# Patient Record
Sex: Male | Born: 1965 | Race: White | Hispanic: No | Marital: Married | Smoking: Former smoker
Health system: Southern US, Community
[De-identification: ages and names within clinical notes are randomized; demographics above are authoritative.]

## PROBLEM LIST (undated history)

## (undated) DIAGNOSIS — J45909 Unspecified asthma, uncomplicated: Secondary | ICD-10-CM

## (undated) DIAGNOSIS — N200 Calculus of kidney: Secondary | ICD-10-CM

## (undated) DIAGNOSIS — T7840XA Allergy, unspecified, initial encounter: Secondary | ICD-10-CM

## (undated) DIAGNOSIS — I10 Essential (primary) hypertension: Secondary | ICD-10-CM

## (undated) DIAGNOSIS — Z720 Tobacco use: Secondary | ICD-10-CM

## (undated) HISTORY — DX: Allergy, unspecified, initial encounter: T78.40XA

## (undated) HISTORY — DX: Unspecified asthma, uncomplicated: J45.909

## (undated) HISTORY — DX: Calculus of kidney: N20.0

## (undated) HISTORY — PX: EYE SURGERY: SHX253

## (undated) HISTORY — DX: Tobacco use: Z72.0

---

## 2004-01-11 ENCOUNTER — Encounter: Admission: RE | Admit: 2004-01-11 | Discharge: 2004-01-11 | Payer: Self-pay | Admitting: Emergency Medicine

## 2007-07-24 ENCOUNTER — Encounter: Admission: RE | Admit: 2007-07-24 | Discharge: 2007-07-24 | Payer: Self-pay | Admitting: Emergency Medicine

## 2011-01-07 ENCOUNTER — Encounter: Payer: Self-pay | Admitting: Emergency Medicine

## 2012-12-12 ENCOUNTER — Ambulatory Visit (INDEPENDENT_AMBULATORY_CARE_PROVIDER_SITE_OTHER): Payer: 59 | Admitting: Emergency Medicine

## 2012-12-12 ENCOUNTER — Ambulatory Visit: Payer: 59

## 2012-12-12 VITALS — BP 142/86 | HR 137 | Temp 102.8°F | Resp 16 | Ht 69.5 in | Wt 167.0 lb

## 2012-12-12 DIAGNOSIS — R05 Cough: Secondary | ICD-10-CM

## 2012-12-12 DIAGNOSIS — R509 Fever, unspecified: Secondary | ICD-10-CM

## 2012-12-12 DIAGNOSIS — R062 Wheezing: Secondary | ICD-10-CM

## 2012-12-12 LAB — POCT CBC
Granulocyte percent: 87.5 %G — AB (ref 37–80)
HCT, POC: 51.2 % (ref 43.5–53.7)
MCH, POC: 32.1 pg — AB (ref 27–31.2)
MCHC: 32 g/dL (ref 31.8–35.4)
MCV: 100.1 fL — AB (ref 80–97)
MID (cbc): 0.8 (ref 0–0.9)
MPV: 8.3 fL (ref 0–99.8)
POC Granulocyte: 9.6 — AB (ref 2–6.9)
POC LYMPH PERCENT: 5.5 %L — AB (ref 10–50)
Platelet Count, POC: 287 10*3/uL (ref 142–424)
RBC: 5.11 M/uL (ref 4.69–6.13)
RDW, POC: 13.3 %

## 2012-12-12 LAB — POCT INFLUENZA A/B
Influenza A, POC: NEGATIVE
Influenza B, POC: NEGATIVE

## 2012-12-12 MED ORDER — ALBUTEROL SULFATE (2.5 MG/3ML) 0.083% IN NEBU: 2.5000 mg | INHALATION_SOLUTION | Freq: Once | RESPIRATORY_TRACT | Status: DC

## 2012-12-12 MED ORDER — IPRATROPIUM BROMIDE 0.02 % IN SOLN: 0.5000 mg | Freq: Once | RESPIRATORY_TRACT | Status: DC

## 2012-12-12 MED ORDER — HYDROCOD POLST-CHLORPHEN POLST 10-8 MG/5ML PO LQCR: 5.0000 mL | Freq: Two times a day (BID) | ORAL | Status: DC | PRN

## 2012-12-12 MED ORDER — AZITHROMYCIN 500 MG PO TABS: 500.0000 mg | ORAL_TABLET | Freq: Every day | ORAL | Status: DC

## 2012-12-12 MED ORDER — ALBUTEROL SULFATE HFA 108 (90 BASE) MCG/ACT IN AERS: 2.0000 | INHALATION_SPRAY | RESPIRATORY_TRACT | Status: DC | PRN

## 2012-12-12 NOTE — Progress Notes (Addendum)
Subjective:    Patient ID: Victor Tran, male    DOB: Oct 06, 1966, 46 y.o.   MRN: [id number]  HPI  This 46 y.o. male presents for evaluation of sudden onset of cough, congestion, fever/chills and myalgias yesterday.  He reports that he gets bronchitis each year, but it's never been this bad. No GU/GI symptoms.   Past Medical History  Diagnosis Date  . Allergy   . Asthma     Past Surgical History  Procedure Date  . Eye surgery     Prior to Admission medications   Not on File    No Known Allergies  History   Social History  . Marital Status: Widowed    Spouse Name: n/a    Number of Children: 0  . Years of Education: 12   Occupational History  . Dock Driver    Social History Main Topics  . Smoking status: Never Smoker   . Smokeless tobacco: Not on file  . Alcohol Use: 12.6 oz/week    21 Cans of beer per week     Comment: 2-3 cans of beer daily  . Drug Use: No  . Sexually Active: Yes    Birth Control/ Protection: Condom     Comment: 2 partners in the past 12 months (11/2012)    Family History  Problem Relation Age of Onset  . Diabetes Mother   . Hypertension Mother   . Heart disease Mother   . Stroke Father   . Heart disease Father   . HIV/AIDS Brother   . Cancer Brother 34    Colon Cancer    Review of Systems As above.    Objective:   Physical Exam Blood pressure 142/86, pulse 137, temperature 102.8 F (39.3 C), temperature source Oral, resp. rate 16, height 5' 9.5" (1.765 m), weight 167 lb (75.751 kg), SpO2 96.00%. Body mass index is 24.31 kg/(m^2). Well-developed, well nourished WM who is awake, alert and oriented, in mild to moderate. HEENT: [place]/AT, PERRL, EOMI.  Sclera and conjunctiva are clear.  EAC are patent, TMs are normal in appearance. Nasal mucosa is congested, pink and moist. OP is clear. Neck: supple, non-tender, no lymphadenopathy, thyromegaly. Heart: tachycardic, regular rhythm, no murmur Lungs: normal effort, diffuse high pitched  musical wheezes throughout both lung fields. Extremities: no cyanosis, clubbing or edema. Skin: warm and dry without rash. Psychologic: good mood and appropriate affect, normal speech and behavior.   Results for orders placed in visit on 12/12/12  POCT CBC      Component Value Range   WBC 11.0 (*) 4.6 - 10.2 K/uL   Lymph, poc 0.6  0.6 - 3.4   POC LYMPH PERCENT 5.5 (*) 10 - 50 %L   MID (cbc) 0.8  0 - 0.9   POC MID % 7.0  0 - 12 %M   POC Granulocyte 9.6 (*) 2 - 6.9   Granulocyte percent 87.5 (*) 37 - 80 %G   RBC 5.11  4.69 - 6.13 M/uL   Hemoglobin 16.4  14.1 - 18.1 g/dL   HCT, POC 82.9  56.2 - 53.7 %   MCV 100.1 (*) 80 - 97 fL   MCH, POC 32.1 (*) 27 - 31.2 pg   MCHC 32.0  31.8 - 35.4 g/dL   RDW, POC 13.0     Platelet Count, POC 287  142 - 424 K/uL   MPV 8.3  0 - 99.8 fL  POCT INFLUENZA A/B      Component Value Range  Influenza A, POC Negative     Influenza B, POC Negative      CXR: [facility] reading (PRIMARY) by  Dr. Cleta Alberts.  Increased markings RIGHT base.      Assessment & Plan:   1. Cough -bronchitis vs. Early RLL infiltrate DG Chest 2 View, azithromycin (ZITHROMAX) 500 MG tablet, chlorpheniramine-HYDROcodone (TUSSIONEX PENNKINETIC ER) 10-8 MG/5ML LQCR  2. Fever  POCT CBC, POCT Influenza A/B, DG Chest 2 View  3. Wheezing  albuterol (PROVENTIL) (2.5 MG/3ML) 0.083% nebulizer solution 2.5 mg, ipratropium (ATROVENT) nebulizer solution 0.5 mg, DG Chest 2 View, albuterol (PROVENTIL HFA;VENTOLIN HFA) 108 (90 BASE) MCG/ACT inhaler

## 2012-12-12 NOTE — Patient Instructions (Addendum)
Get plenty of rest and drink at least 64 ounces of water daily. I encourage you to cut back on your alcohol consumption and tobacco use.

## 2012-12-16 ENCOUNTER — Ambulatory Visit (INDEPENDENT_AMBULATORY_CARE_PROVIDER_SITE_OTHER): Payer: 59 | Admitting: Physician Assistant

## 2012-12-16 VITALS — BP 116/78 | HR 114 | Temp 98.8°F | Resp 18 | Ht 69.5 in | Wt 166.2 lb

## 2012-12-16 DIAGNOSIS — R509 Fever, unspecified: Secondary | ICD-10-CM

## 2012-12-16 DIAGNOSIS — R059 Cough, unspecified: Secondary | ICD-10-CM

## 2012-12-16 DIAGNOSIS — R05 Cough: Secondary | ICD-10-CM

## 2012-12-16 DIAGNOSIS — R062 Wheezing: Secondary | ICD-10-CM

## 2012-12-16 LAB — POCT CBC
HCT, POC: 50.3 % (ref 43.5–53.7)
Hemoglobin: 16.2 g/dL (ref 14.1–18.1)
MCH, POC: 32 pg — AB (ref 27–31.2)
MCHC: 32.2 g/dL (ref 31.8–35.4)
MCV: 99.3 fL — AB (ref 80–97)
MID (cbc): 0.9 (ref 0–0.9)
MPV: 7.9 fL (ref 0–99.8)
POC Granulocyte: 3.3 (ref 2–6.9)
POC LYMPH PERCENT: 30.6 %L (ref 10–50)
POC MID %: 14.5 %M — AB (ref 0–12)
RBC: 5.07 M/uL (ref 4.69–6.13)
WBC: 6 10*3/uL (ref 4.6–10.2)

## 2012-12-16 MED ORDER — IPRATROPIUM BROMIDE 0.02 % IN SOLN: 0.5000 mg | Freq: Once | RESPIRATORY_TRACT | Status: DC

## 2012-12-16 MED ORDER — ALBUTEROL SULFATE (2.5 MG/3ML) 0.083% IN NEBU: 2.5000 mg | INHALATION_SOLUTION | Freq: Once | RESPIRATORY_TRACT | Status: DC

## 2012-12-16 MED ORDER — PREDNISONE 20 MG PO TABS: ORAL_TABLET | ORAL | Status: DC

## 2012-12-16 NOTE — Patient Instructions (Addendum)
Get plenty of rest and drink at least 64 ounces of water daily. 

## 2012-12-16 NOTE — Progress Notes (Signed)
Subjective:    Patient ID: Victor Tran, male    DOB: 1966-11-05, 46 y.o.   MRN: [id number]  HPI This 46 y.o. male presents for evaluation of persistent illness.  He was evaluated for respiratory infection 12/12/2012.  CBC revealed a mildly elevated WBC and there was a question of an early infiltrate in the RLL.  Influenza test was negative. He started Azithromycin 500 mg QD, Tussionex, and albuterol.    He reports today that he is minimally improved.  He also reports continued fevers, 99.2-102.8. The cough is worse with lying down.  Not smoking, but his girlfriend is. She is with him today, and is a Engineer, civil (consulting) (she is not currently working due to back surgery a couple of months ago).   Past Medical History  Diagnosis Date  . Allergy   . Asthma     Past Surgical History  Procedure Date  . Eye surgery     Prior to Admission medications   Medication Sig Start Date End Date Taking? Authorizing Provider  albuterol (PROVENTIL HFA;VENTOLIN HFA) 108 (90 BASE) MCG/ACT inhaler Inhale 2 puffs into the lungs every 4 (four) hours as needed for wheezing (cough, shortness of breath or wheezing.). 12/12/12  Yes Kaston Faughn S Meaghann Choo, PA-C  azithromycin (ZITHROMAX) 500 MG tablet Take 1 tablet (500 mg total) by mouth daily. 12/12/12  Yes Edahi Kroening S Luvenia Cranford, PA-C  chlorpheniramine-HYDROcodone (TUSSIONEX PENNKINETIC ER) 10-8 MG/5ML LQCR Take 5 mLs by mouth every 12 (twelve) hours as needed (cough). 12/12/12  Yes Andreina Outten S Nithya Meriweather, PA-C    No Known Allergies  History   Social History  . Marital Status: Widowed    Spouse Name: n/a    Number of Children: 0  . Years of Education: 12   Occupational History  . Dock Driver    Social History Main Topics  . Smoking status: Never Smoker   . Smokeless tobacco: Not on file  . Alcohol Use: 12.6 oz/week    21 Cans of beer per week     Comment: 2-3 cans of beer daily  . Drug Use: No  . Sexually Active: Yes    Birth Control/ Protection: Condom     Comment: 2  partners in the past 12 months (11/2012)    Family History  Problem Relation Age of Onset  . Diabetes Mother   . Hypertension Mother   . Heart disease Mother   . Stroke Father   . Heart disease Father   . HIV/AIDS Brother   . Cancer Brother 49    Colon Cancer   Review of Systems As above.    Objective:   Physical Exam Blood pressure 116/78, pulse 114, temperature 98.8 F (37.1 C), temperature source Oral, resp. rate 18, height 5' 9.5" (1.765 m), weight 166 lb 3.2 oz (75.388 kg), SpO2 98.00%. Body mass index is 24.19 kg/(m^2). Well-developed, well nourished WM who is awake, alert and oriented, in moderate distress. HEENT: [place]/AT, PERRL, EOMI.  Sclera and conjunctiva are clear.  EAC are patent, TMs are normal in appearance. Nasal mucosa is pink and moist. OP is clear. Neck: supple, non-tender, no lymphadenopathy, thyromegaly. Heart: tachycardia, regular rhythm, no murmur Lungs: increased effort, and rate, improved with neb treatment. High-pitched musical wheezes throughout, also reduced with neb, though not resolved. Abdomen: normo-active bowel sounds, supple, non-tender, no mass or organomegaly. Extremities: no cyanosis, clubbing or edema. Skin: warm and dry without rash. Psychologic: good mood and appropriate affect, normal speech and behavior.   Results for orders placed in  visit on 12/16/12  POCT CBC      Component Value Range   WBC 6.0  4.6 - 10.2 K/uL   Lymph, poc 1.8  0.6 - 3.4   POC LYMPH PERCENT 30.6  10 - 50 %L   MID (cbc) 0.9  0 - 0.9   POC MID % 14.5 (*) 0 - 12 %M   POC Granulocyte 3.3  2 - 6.9   Granulocyte percent 54.9  37 - 80 %G   RBC 5.07  4.69 - 6.13 M/uL   Hemoglobin 16.2  14.1 - 18.1 g/dL   HCT, POC 16.1  09.6 - 53.7 %   MCV 99.3 (*) 80 - 97 fL   MCH, POC 32.0 (*) 27 - 31.2 pg   MCHC 32.2  31.8 - 35.4 g/dL   RDW, POC 04.5     Platelet Count, POC 330  142 - 424 K/uL   MPV 7.9  0 - 99.8 fL       Assessment & Plan:   1. Cough  predniSONE  (DELTASONE) 20 MG tablet  2. Wheezing  albuterol (PROVENTIL) (2.5 MG/3ML) 0.083% nebulizer solution 2.5 mg, ipratropium (ATROVENT) nebulizer solution 0.5 mg, predniSONE (DELTASONE) 20 MG tablet  3. Fever  POCT CBC   I suspect that the initial illness was viral, and prescribed the antibiotic due to his smoking and very mild elevated WBC.  He most likely has influenza.  He'll complete the 5 day course of azithromycin and start a prednisone taper today, and continue the tussionex and albuterol at home.  OOW through 12/18/12, RTW 12/19/12.  If unable, he should RTC for re-evaluation.

## 2012-12-21 ENCOUNTER — Emergency Department (INDEPENDENT_AMBULATORY_CARE_PROVIDER_SITE_OTHER): Payer: 59

## 2012-12-21 ENCOUNTER — Encounter (HOSPITAL_COMMUNITY): Payer: Self-pay | Admitting: Emergency Medicine

## 2012-12-21 ENCOUNTER — Emergency Department (HOSPITAL_COMMUNITY)
Admission: EM | Admit: 2012-12-21 | Discharge: 2012-12-21 | Disposition: A | Discharge status: Nursing Home (Includes ICF) | Payer: 59 | Source: Home / Self Care | Attending: Family Medicine | Admitting: Family Medicine

## 2012-12-21 ENCOUNTER — Encounter (HOSPITAL_COMMUNITY): Payer: Self-pay | Admitting: Nurse Practitioner

## 2012-12-21 ENCOUNTER — Inpatient Hospital Stay (HOSPITAL_COMMUNITY)
Admission: EM | Admit: 2012-12-21 | Discharge: 2012-12-23 | DRG: 194 | Disposition: A | Discharge status: Home / Self Care | Payer: 59 | Attending: Internal Medicine | Admitting: Internal Medicine

## 2012-12-21 DIAGNOSIS — I498 Other specified cardiac arrhythmias: Secondary | ICD-10-CM | POA: Diagnosis present

## 2012-12-21 DIAGNOSIS — J189 Pneumonia, unspecified organism: Principal | ICD-10-CM | POA: Diagnosis present

## 2012-12-21 DIAGNOSIS — J438 Other emphysema: Secondary | ICD-10-CM

## 2012-12-21 DIAGNOSIS — D72829 Elevated white blood cell count, unspecified: Secondary | ICD-10-CM | POA: Diagnosis present

## 2012-12-21 DIAGNOSIS — Z79899 Other long term (current) drug therapy: Secondary | ICD-10-CM

## 2012-12-21 DIAGNOSIS — IMO0002 Reserved for concepts with insufficient information to code with codable children: Secondary | ICD-10-CM

## 2012-12-21 DIAGNOSIS — T380X5A Adverse effect of glucocorticoids and synthetic analogues, initial encounter: Secondary | ICD-10-CM | POA: Diagnosis present

## 2012-12-21 DIAGNOSIS — R0902 Hypoxemia: Secondary | ICD-10-CM | POA: Diagnosis present

## 2012-12-21 DIAGNOSIS — J45909 Unspecified asthma, uncomplicated: Secondary | ICD-10-CM | POA: Diagnosis present

## 2012-12-21 DIAGNOSIS — J439 Emphysema, unspecified: Secondary | ICD-10-CM

## 2012-12-21 DIAGNOSIS — R000 Tachycardia, unspecified: Secondary | ICD-10-CM

## 2012-12-21 DIAGNOSIS — J45901 Unspecified asthma with (acute) exacerbation: Secondary | ICD-10-CM | POA: Diagnosis present

## 2012-12-21 DIAGNOSIS — R05 Cough: Secondary | ICD-10-CM

## 2012-12-21 DIAGNOSIS — Z87891 Personal history of nicotine dependence: Secondary | ICD-10-CM

## 2012-12-21 DIAGNOSIS — R062 Wheezing: Secondary | ICD-10-CM

## 2012-12-21 LAB — CBC
HCT: 41.9 % (ref 39.0–52.0)
Hemoglobin: 15 g/dL (ref 13.0–17.0)
MCH: 32.5 pg (ref 26.0–34.0)
MCHC: 35.8 g/dL (ref 30.0–36.0)
MCV: 90.7 fL (ref 78.0–100.0)
Platelets: 378 K/uL (ref 150–400)
Platelets: 426 10*3/uL — ABNORMAL HIGH (ref 150–400)
RBC: 4.62 MIL/uL (ref 4.22–5.81)
RBC: 4.43 MIL/uL (ref 4.22–5.81)
RDW: 12.1 % (ref 11.5–15.5)
RDW: 12.3 % (ref 11.5–15.5)
WBC: 15.7 K/uL — ABNORMAL HIGH (ref 4.0–10.5)
WBC: 20.1 10*3/uL — ABNORMAL HIGH (ref 4.0–10.5)

## 2012-12-21 LAB — BASIC METABOLIC PANEL
BUN: 12 mg/dL (ref 6–23)
CO2: 22 mEq/L (ref 19–32)
Calcium: 8.9 mg/dL (ref 8.4–10.5)
Chloride: 96 mEq/L (ref 96–112)
Creatinine, Ser: 0.97 mg/dL (ref 0.50–1.35)
GFR calc Af Amer: >90 mL/min (ref >90)
GFR calc non Af Amer: >90 mL/min (ref >90)
Glucose, Bld: 239 mg/dL — ABNORMAL HIGH (ref 70–99)
Potassium: 3.8 mEq/L (ref 3.5–5.1)
Sodium: 132 mEq/L — ABNORMAL LOW (ref 135–145)

## 2012-12-21 LAB — GLUCOSE, CAPILLARY: Glucose-Capillary: 244 mg/dL — ABNORMAL HIGH (ref 70–99)

## 2012-12-21 LAB — CREATININE, SERUM
Creatinine, Ser: 1.06 mg/dL (ref 0.50–1.35)
GFR calc Af Amer: >90 mL/min (ref >90)

## 2012-12-21 LAB — D-DIMER, QUANTITATIVE: D-Dimer, Quant: <0.27 ug/mL-FEU (ref 0.00–0.48)

## 2012-12-21 MED ORDER — SODIUM CHLORIDE 0.9 % IV SOLN
INTRAVENOUS | Status: DC
  Administered 2012-12-21: 19:00:00 via INTRAVENOUS

## 2012-12-21 MED ORDER — METHYLPREDNISOLONE SODIUM SUCC 125 MG IJ SOLR
INTRAMUSCULAR | Status: AC
  Filled 2012-12-21: qty 2

## 2012-12-21 MED ORDER — IPRATROPIUM BROMIDE 0.02 % IN SOLN
0.5000 mg | Freq: Once | RESPIRATORY_TRACT | Status: AC
  Administered 2012-12-21: 0.5 mg via RESPIRATORY_TRACT
  Filled 2012-12-21: qty 2.5

## 2012-12-21 MED ORDER — OSELTAMIVIR PHOSPHATE 75 MG PO CAPS
75.0000 mg | ORAL_CAPSULE | Freq: Two times a day (BID) | ORAL | Status: DC
  Administered 2012-12-21: 75 mg via ORAL
  Filled 2012-12-21 (×3): qty 1

## 2012-12-21 MED ORDER — AZITHROMYCIN 250 MG PO TABS: 250.0000 mg | ORAL_TABLET | Freq: Every day | ORAL | Status: DC

## 2012-12-21 MED ORDER — METHYLPREDNISOLONE SODIUM SUCC 125 MG IJ SOLR
125.0000 mg | Freq: Once | INTRAMUSCULAR | Status: AC
  Administered 2012-12-21: 125 mg via INTRAMUSCULAR

## 2012-12-21 MED ORDER — GUAIFENESIN-CODEINE 100-10 MG/5ML PO SYRP: 5.0000 mL | ORAL_SOLUTION | Freq: Three times a day (TID) | ORAL | Status: DC | PRN

## 2012-12-21 MED ORDER — DEXTROSE 5 % IV SOLN
500.0000 mg | Freq: Once | INTRAVENOUS | Status: AC
  Administered 2012-12-21: 500 mg via INTRAVENOUS
  Filled 2012-12-21: qty 500

## 2012-12-21 MED ORDER — ALBUTEROL SULFATE HFA 108 (90 BASE) MCG/ACT IN AERS: 2.0000 | INHALATION_SPRAY | Freq: Once | RESPIRATORY_TRACT | Status: DC

## 2012-12-21 MED ORDER — LEVALBUTEROL HCL 0.63 MG/3ML IN NEBU
0.6300 mg | INHALATION_SOLUTION | Freq: Four times a day (QID) | RESPIRATORY_TRACT | Status: DC
  Administered 2012-12-21 – 2012-12-22 (×5): 0.63 mg via RESPIRATORY_TRACT
  Filled 2012-12-21 (×7): qty 3

## 2012-12-21 MED ORDER — IPRATROPIUM BROMIDE 0.02 % IN SOLN
0.5000 mg | Freq: Four times a day (QID) | RESPIRATORY_TRACT | Status: DC
  Administered 2012-12-21 – 2012-12-22 (×5): 0.5 mg via RESPIRATORY_TRACT
  Filled 2012-12-21 (×5): qty 2.5

## 2012-12-21 MED ORDER — SODIUM CHLORIDE 0.9 % IV SOLN
INTRAVENOUS | Status: DC
  Administered 2012-12-21 – 2012-12-23 (×3): via INTRAVENOUS

## 2012-12-21 MED ORDER — DEXTROSE 5 % IV SOLN
1.0000 g | Freq: Once | INTRAVENOUS | Status: AC
  Administered 2012-12-21: 1 g via INTRAVENOUS
  Filled 2012-12-21: qty 10

## 2012-12-21 MED ORDER — ALBUTEROL SULFATE (5 MG/ML) 0.5% IN NEBU
5.0000 mg | INHALATION_SOLUTION | Freq: Once | RESPIRATORY_TRACT | Status: AC
  Administered 2012-12-21: 5 mg via RESPIRATORY_TRACT

## 2012-12-21 MED ORDER — HYDROCOD POLST-CHLORPHEN POLST 10-8 MG/5ML PO LQCR
5.0000 mL | Freq: Two times a day (BID) | ORAL | Status: DC
  Administered 2012-12-21 – 2012-12-23 (×4): 5 mL via ORAL
  Filled 2012-12-21 (×4): qty 5

## 2012-12-21 MED ORDER — LEVOFLOXACIN IN D5W 750 MG/150ML IV SOLN
750.0000 mg | INTRAVENOUS | Status: DC
  Administered 2012-12-21 – 2012-12-22 (×2): 750 mg via INTRAVENOUS
  Filled 2012-12-21 (×3): qty 150

## 2012-12-21 MED ORDER — GUAIFENESIN-CODEINE 100-10 MG/5ML PO SOLN
10.0000 mL | Freq: Once | ORAL | Status: AC
  Administered 2012-12-21: 10 mL via ORAL
  Filled 2012-12-21: qty 10

## 2012-12-21 MED ORDER — ALBUTEROL SULFATE (5 MG/ML) 0.5% IN NEBU: 2.5000 mg | INHALATION_SOLUTION | RESPIRATORY_TRACT | Status: DC | PRN

## 2012-12-21 MED ORDER — ACETAMINOPHEN 325 MG PO TABS: 650.0000 mg | ORAL_TABLET | Freq: Four times a day (QID) | ORAL | Status: DC | PRN

## 2012-12-21 MED ORDER — ALBUTEROL SULFATE (5 MG/ML) 0.5% IN NEBU
INHALATION_SOLUTION | RESPIRATORY_TRACT | Status: AC
  Filled 2012-12-21: qty 1

## 2012-12-21 MED ORDER — ALBUTEROL (5 MG/ML) CONTINUOUS INHALATION SOLN
15.0000 mg | INHALATION_SOLUTION | Freq: Once | RESPIRATORY_TRACT | Status: AC
  Administered 2012-12-21: 15 mg via RESPIRATORY_TRACT
  Filled 2012-12-21: qty 20

## 2012-12-21 MED ORDER — INSULIN ASPART 100 UNIT/ML ~~LOC~~ SOLN
0.0000 [IU] | Freq: Every day | SUBCUTANEOUS | Status: DC
  Administered 2012-12-21 – 2012-12-22 (×2): 2 [IU] via SUBCUTANEOUS

## 2012-12-21 MED ORDER — METHYLPREDNISOLONE SODIUM SUCC 125 MG IJ SOLR
80.0000 mg | Freq: Three times a day (TID) | INTRAMUSCULAR | Status: DC
  Administered 2012-12-21: 80 mg via INTRAVENOUS
  Filled 2012-12-21 (×4): qty 1.28

## 2012-12-21 MED ORDER — ENOXAPARIN SODIUM 40 MG/0.4ML ~~LOC~~ SOLN
40.0000 mg | SUBCUTANEOUS | Status: DC
  Administered 2012-12-21 – 2012-12-22 (×2): 40 mg via SUBCUTANEOUS
  Filled 2012-12-21 (×3): qty 0.4

## 2012-12-21 MED ORDER — INSULIN ASPART 100 UNIT/ML ~~LOC~~ SOLN
0.0000 [IU] | Freq: Three times a day (TID) | SUBCUTANEOUS | Status: DC
Start: 2012-12-22 — End: 2012-12-23
  Administered 2012-12-22 – 2012-12-23 (×2): 3 [IU] via SUBCUTANEOUS

## 2012-12-21 MED ORDER — PANTOPRAZOLE SODIUM 40 MG PO TBEC
40.0000 mg | DELAYED_RELEASE_TABLET | Freq: Every day | ORAL | Status: DC
  Administered 2012-12-22: 40 mg via ORAL
  Filled 2012-12-21: qty 1

## 2012-12-21 MED ORDER — IPRATROPIUM BROMIDE 0.02 % IN SOLN
0.5000 mg | Freq: Once | RESPIRATORY_TRACT | Status: AC
  Administered 2012-12-21: 0.5 mg via RESPIRATORY_TRACT

## 2012-12-21 MED ORDER — SODIUM CHLORIDE 0.9 % IV BOLUS (SEPSIS)
1000.0000 mL | Freq: Once | INTRAVENOUS | Status: AC
  Administered 2012-12-21: 1000 mL via INTRAVENOUS

## 2012-12-21 MED ORDER — ALBUTEROL SULFATE (5 MG/ML) 0.5% IN NEBU
5.0000 mg | INHALATION_SOLUTION | Freq: Once | RESPIRATORY_TRACT | Status: AC
  Administered 2012-12-21: 5 mg via RESPIRATORY_TRACT
  Filled 2012-12-21: qty 1

## 2012-12-21 NOTE — ED Provider Notes (Signed)
History    46yM with cough and sob. Onset around christmas. Progressively worsening. Seen at [facility] today and referred to ED for further eval after had CXR consistent with pneumonia. Patient is a smoker. Is complaining of some chest pain only when he calms. Subjective fever. No unusual leg pain or swelling. Denies history of blood clot.   CSN: [id number]  Arrival date & time 12/21/12  1248   First MD Initiated Contact with Patient 12/21/12 1333      Chief Complaint  Patient presents with  . Shortness of Breath    (Consider location/radiation/quality/duration/timing/severity/associated sxs/prior treatment) HPI  Past Medical History  Diagnosis Date  . Allergy   . Asthma     Past Surgical History  Procedure Date  . Eye surgery     Family History  Problem Relation Age of Onset  . Diabetes Mother   . Hypertension Mother   . Heart disease Mother   . Stroke Father   . Heart disease Father   . HIV/AIDS Brother   . Cancer Brother 75    Colon Cancer    History  Substance Use Topics  . Smoking status: Never Smoker   . Smokeless tobacco: Not on file  . Alcohol Use: 12.6 oz/week    21 Cans of beer per week     Comment: 2-3 cans of beer daily      Review of Systems  All systems reviewed and negative, other than as noted in HPI.   Allergies  Review of patient's allergies indicates no known allergies.  Home Medications   Current Outpatient Rx  Name  Route  Sig  Dispense  Refill  . ALBUTEROL SULFATE HFA 108 (90 BASE) MCG/ACT IN AERS   Inhalation   Inhale 2 puffs into the lungs every 4 (four) hours as needed for wheezing (cough, shortness of breath or wheezing.).   1 Inhaler   1   . AZITHROMYCIN 500 MG PO TABS   Oral   Take 1 tablet (500 mg total) by mouth daily.   3 tablet   0   . HYDROCOD POLST-CPM POLST ER 10-8 MG/5ML PO LQCR   Oral   Take 5 mLs by mouth every 12 (twelve) hours as needed (cough).   140 mL   0   . PREDNISONE 20 MG PO TABS      Take 3 PO  QAM x3days, 2 PO QAM x3days, 1 PO QAM x3days   18 tablet   0     BP 129/83  Pulse 104  Temp 97 F (36.1 C) (Oral)  Resp 16  SpO2 94%  Physical Exam  Nursing note and vitals reviewed. Constitutional: He appears well-developed and well-nourished. No distress.  HENT:  Head: Normocephalic and atraumatic.  Eyes: Conjunctivae normal are normal. Right eye exhibits no discharge. Left eye exhibits no discharge.  Neck: Neck supple.  Cardiovascular: Regular rhythm and normal heart sounds.  Exam reveals no gallop and no friction rub.   No murmur heard.      Tachycardic with a regular rhythm  Pulmonary/Chest: Effort normal. He has wheezes.       Expiratory wheezing bilaterally. Mild tachypnea.  Abdominal: Soft. He exhibits no distension. There is no tenderness.  Musculoskeletal: He exhibits no edema and no tenderness.       Lower extremities symmetric as compared to each other. No calf tenderness. Negative Homan's. No palpable cords.   Neurological: He is alert.  Skin: Skin is warm and dry. He is not diaphoretic.  Psychiatric: He has a normal mood and affect. His behavior is normal. Thought content normal.    ED Course  Procedures (including critical care time)  Labs Reviewed - No data to display No results found.   1. CAP (community acquired pneumonia)   2. Reactive airway disease with wheezing   3. Tachycardia   4. Cough   5. Wheezing   6. PNA (pneumonia)       MDM  47 year old male with cough and shortness of breath. Chest x-ray is consistent with pneumonia. Patient remains hypoxic on room air the high 80s to low 90s despite multiple nebs. Will admit.    2:06 PM Reassessed. Better air movement after neb. Abx about to be hung. Will repeat neb and continue to monitor.      Raeford Razor, MD 12/24/12 [phone]

## 2012-12-21 NOTE — ED Provider Notes (Signed)
History     CSN: [id number]  Arrival date & time 12/21/12  1110   First MD Initiated Contact with Patient 12/21/12 1123      Chief Complaint  Patient presents with  . URI    sob. productive cough. runny nose. chest heaviness.    (Consider location/radiation/quality/duration/timing/severity/associated sxs/prior treatment) Patient is a 47 y.o. male presenting with URI. The history is provided by the patient.  URI The primary symptoms include fever, cough and wheezing. Primary symptoms do not include nausea or vomiting. Episode onset: sick since 12/25, [facility] and given neb, prednisone and albut meds without relief. This is a new problem. The problem has been gradually worsening.  Risk factors for severe complications from URI include chronic respiratory disease (girlfriend smokes., has childhood h/o asthma.).    Past Medical History  Diagnosis Date  . Allergy   . Asthma     Past Surgical History  Procedure Date  . Eye surgery     Family History  Problem Relation Age of Onset  . Diabetes Mother   . Hypertension Mother   . Heart disease Mother   . Stroke Father   . Heart disease Father   . HIV/AIDS Brother   . Cancer Brother 16    Colon Cancer    History  Substance Use Topics  . Smoking status: Never Smoker   . Smokeless tobacco: Not on file  . Alcohol Use: 12.6 oz/week    21 Cans of beer per week     Comment: 2-3 cans of beer daily      Review of Systems  Constitutional: Positive for fever.  HENT: Negative.   Respiratory: Positive for cough, chest tightness, shortness of breath and wheezing.   Cardiovascular: Negative.   Gastrointestinal: Negative.  Negative for nausea and vomiting.    Allergies  Review of patient's allergies indicates no known allergies.  Home Medications   Current Outpatient Rx  Name  Route  Sig  Dispense  Refill  . ALBUTEROL SULFATE HFA 108 (90 BASE) MCG/ACT IN AERS   Inhalation   Inhale 2 puffs into the  lungs every 4 (four) hours as needed for wheezing (cough, shortness of breath or wheezing.).   1 Inhaler   1   . HYDROCOD POLST-CPM POLST ER 10-8 MG/5ML PO LQCR   Oral   Take 5 mLs by mouth every 12 (twelve) hours as needed (cough).   140 mL   0   . PREDNISONE 20 MG PO TABS      Take 3 PO QAM x3days, 2 PO QAM x3days, 1 PO QAM x3days   18 tablet   0   . AZITHROMYCIN 500 MG PO TABS   Oral   Take 1 tablet (500 mg total) by mouth daily.   3 tablet   0     BP 118/82  Pulse 92  Temp 98.7 F (37.1 C) (Oral)  Resp 18  SpO2 95%  Physical Exam  Nursing note and vitals reviewed. Constitutional: He is oriented to person, place, and time. He appears well-developed and well-nourished. He appears distressed.  HENT:  Head: Normocephalic.  Right Ear: External ear normal.  Left Ear: External ear normal.  Mouth/Throat: Oropharynx is clear and moist.  Eyes: Pupils are equal, round, and reactive to light.  Neck: Normal range of motion. Neck supple.  Cardiovascular: Normal rate and normal heart sounds.   Pulmonary/Chest: He has decreased breath sounds. He has wheezes. He has rales in the right  lower field.  Abdominal: Soft. Bowel sounds are normal.  Lymphadenopathy:    He has no cervical adenopathy.  Neurological: He is alert and oriented to person, place, and time.  Skin: Skin is warm and dry.    ED Course  Procedures (including critical care time)  Labs Reviewed - No data to display Dg Chest 2 View  12/21/2012  *RADIOLOGY REPORT*  Clinical Data: Cough and shortness of breath  CHEST - 2 VIEW  Comparison: 12/12/2012  Findings: Hyperinflation suggests COPD/emphysema.  There is subtle right middle lobe airspace opacity, new since the prior study.  No pleural effusion.  Heart size normal.  No acute osseous abnormality. Minimal patchy left lower lobe airspace opacities also noted.  IMPRESSION: Right middle lobe airspace disease, which could indicate pneumonia. Follow-up to radiographic  resolution is recommended in 4-6 weeks.  Left lower lobe airspace opacity, which could represent atelectasis or pneumonia and also amenable to follow-up.   Original Report Authenticated By: Christiana Pellant, M.D.      1. 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         Linna Hoff, MD 12/21/12 1230

## 2012-12-21 NOTE — H&P (Signed)
PATIENT DETAILS Name: Victor Tran Age: 47 y.o. Sex: male Date of Birth: Sep 08, 1966 Admit Date: 12/21/2012 PCP:No primary provider on file.   CHIEF COMPLAINT:  Cough, fever and shortness of breath with wheezing since 12/12/2012  HPI: Patient is a 47 year old Caucasian male with a history of childhood asthma, ex-smoker (quit 6 years ago) who presented to the hospital after being referred from the urgent care clinic for evaluation of the above noted complaints. Patient's history dates back to 12/12/12 when he started having dry cough with fever. He also started having shortness of breath and was wheezing. He was seen at a local urgent care and was thought to have acute bronchitis. He was given a 3 day course of Z-Pak, tapering steroids and a cough syrup. Apparently a influenza panel was negative then. He again presented to the same urgent care on 12/30 because of minimal improvement. He was asked to continue the same treatment. He presented back to be urgent care clinic today for persistent cough and wheezing. Chest x-ray done showed right middle lobe airspace disease and a possible left lower lobe airspace opacity as well. Patient was also found to be slightly hypoxic on room air, he was found to have a white count of 15,000 (but on steroids), I was subsequently asked to admit this patient for further evaluation and treatment.  - Patient claims that his cough is now mostly productive with yellowish phlegm. There is no history of headache. He does not have any chest pain. He has received numerous nebulized bronchodilators here in the Emergency room and his wheezing is somewhat better. She denies any abdominal pain. He denies any nasal congestion. He denies any nausea, vomiting or diarrhea.  He denies any rash  - His significant other his RN. He is exposed to secondhand smoke.    ALLERGIES:  No Known Allergies  PAST MEDICAL HISTORY: Past Medical History  Diagnosis Date  . Allergy   . Asthma       PAST SURGICAL HISTORY: Past Surgical History  Procedure Date  . Eye surgery     MEDICATIONS AT HOME: Prior to Admission medications   Medication Sig Start Date End Date Taking? Authorizing Provider  albuterol (PROVENTIL HFA;VENTOLIN HFA) 108 (90 BASE) MCG/ACT inhaler Inhale 2 puffs into the lungs every 4 (four) hours as needed for wheezing (cough, shortness of breath or wheezing.). 12/12/12  Yes Chelle S Jeffery, PA-C  chlorpheniramine-HYDROcodone (TUSSIONEX PENNKINETIC ER) 10-8 MG/5ML LQCR Take 5 mLs by mouth every 12 (twelve) hours as needed (cough). 12/12/12  Yes Chelle S Jeffery, PA-C  guaiFENesin (MUCINEX) 600 MG 12 hr tablet Take 1,200 mg by mouth daily as needed. For cough   Yes Historical Provider, MD  ibuprofen (ADVIL,MOTRIN) 200 MG tablet Take 400 mg by mouth every 6 (six) hours as needed. For pain   Yes Historical Provider, MD  predniSONE (DELTASONE) 20 MG tablet Take 3 PO QAM x3days, 2 PO QAM x3days, 1 PO QAM x3days 12/16/12  Yes Chelle S Jeffery, PA-C  azithromycin (ZITHROMAX Z-PAK) 250 MG tablet Take 1 tablet (250 mg total) by mouth daily. 12/21/12   Raeford Razor, MD  guaiFENesin-codeine [facility]) 100-10 MG/5ML syrup Take 5 mLs by mouth 3 (three) times daily as needed for cough. 12/21/12   Raeford Razor, MD    FAMILY HISTORY: Family History  Problem Relation Age of Onset  . Diabetes Mother   . Hypertension Mother   . Heart disease Mother   . Stroke Father   . Heart disease  Father   . HIV/AIDS Brother   . Cancer Brother 21    Colon Cancer    SOCIAL HISTORY:  reports that he has never smoked. He does not have any smokeless tobacco history on file. He reports that he drinks about 12.6 ounces of alcohol per week. He reports that he does not use illicit drugs.  REVIEW OF SYSTEMS:  Constitutional:   No  weight loss, night sweats,  chills, fatigue.  HEENT:    No headaches, Difficulty swallowing,Tooth/dental problems,Sore throat,  No sneezing, itching, ear ache,  nasal congestion, post nasal drip,   Cardio-vascular: No chest pain,  Orthopnea, PND, swelling in lower extremities, anasarca, dizziness, palpitations  GI:  No heartburn, indigestion, abdominal pain, nausea, vomiting, diarrhea, change in bowel habits, loss of appetite  Resp: No coughing up of blood.No chest wall deformity  Skin:  no rash or lesions.  GU:  no dysuria, change in color of urine, no urgency or frequency.  No flank pain.  Musculoskeletal: No joint pain or swelling.  No decreased range of motion.  No back pain.  Psych: No change in mood or affect. No depression or anxiety.  No memory loss.   PHYSICAL EXAM: Blood pressure 124/63, pulse 96, temperature 98.4 F (36.9 C), temperature source Oral, resp. rate 20, SpO2 92.00%.  General appearance :Awake, alert, not in any distress. Speech Clear. Not toxic Looking HEENT: Atraumatic and Normocephalic, pupils equally reactive to light and accomodation Neck: supple, no JVD. No cervical lymphadenopathy.  Chest:Good air entry bilaterally, few scattered rhonchi  CVS: S1 S2 regular, no murmurs.  Tachycardic (received one hour of nebulizer just 10-15 minutes prior to my exam) Abdomen: Bowel sounds present, Non tender and not distended with no gaurding, rigidity or rebound. Extremities: B/L Lower Ext shows no edema, both legs are warm to touch Neurology: Awake alert, and oriented X 3, CN II-XII intact, Non focal Skin:No Rash Wounds:N/A  LABS ON ADMISSION:   Basename 12/21/12 1533  NA 132*  K 3.8  CL 96  CO2 22  GLUCOSE 239*  BUN 12  CREATININE 0.97  CALCIUM 8.9  MG --  PHOS --   No results found for this basename: AST:2,ALT:2,ALKPHOS:2,BILITOT:2,PROT:2,ALBUMIN:2 in the last 72 hours No results found for this basename: LIPASE:2,AMYLASE:2 in the last 72 hours  Basename 12/21/12 1533  WBC 15.7*  NEUTROABS --  HGB 15.0  HCT 41.9  MCV 90.7  PLT 378   No results found for this basename:  CKTOTAL:3,CKMB:3,CKMBINDEX:3,TROPONINI:3 in the last 72 hours No results found for this basename: DDIMER:2 in the last 72 hours No components found with this basename: POCBNP:3   RADIOLOGIC STUDIES ON ADMISSION: Dg Chest 2 View  12/21/2012  *RADIOLOGY REPORT*  Clinical Data: Cough and shortness of breath  CHEST - 2 VIEW  Comparison: 12/12/2012  Findings: Hyperinflation suggests COPD/emphysema.  There is subtle right middle lobe airspace opacity, new since the prior study.  No pleural effusion.  Heart size normal.  No acute osseous abnormality. Minimal patchy left lower lobe airspace opacities also noted.  IMPRESSION: Right middle lobe airspace disease, which could indicate pneumonia. Follow-up to radiographic resolution is recommended in 4-6 weeks.  Left lower lobe airspace opacity, which could represent atelectasis or pneumonia and also amenable to follow-up.   Original Report Authenticated By: Christiana Pellant, M.D.     ASSESSMENT AND PLAN: Present on Admission:  . PNA (pneumonia) - Likely community-acquired pneumonia -Will start on Levaquin  - Will obtain blood cultures if febrile  - Without  influenza, however obtain PCR and empirically start on Tamiflu. If PCR is negative, will discontinue Tamiflu.  . Reactive airway disease with wheezing - Has a history of asthma- when he was a child  - Possible reactive airway disease triggered by pneumonia  - Will start IV Solu-Medrol, and give scheduled nebulized bronchodilators.   .Sinus tachycardia - Likely secondary to side effects of nebulized albuterol-patient had just received one hour off nebulizer treatment with albuterol a few minutes prior to my exam - We'll continue to monitor on telemetry, if persistent then we'll pursue a CT angiogram of the chest.  Further plan will depend as patient's clinical course evolves and further radiologic and laboratory data become available. Patient will be monitored closely.  DVT Prophylaxis: -  Prophylactic Lovenox   Code Status: - Full code   Total time spent for admission equals 45 minutes.  [facility] Pager [phone]  If 7PM-7AM, please contact night-coverage [email or web address] Password [facility] 12/21/2012, 5:32 PM

## 2012-12-21 NOTE — ED Notes (Signed)
Peak flow of 200

## 2012-12-21 NOTE — ED Notes (Signed)
MD at bedside. Kohut MD 

## 2012-12-21 NOTE — ED Notes (Signed)
Pt c/o sob. Chest tightness. Runny nose. Productive cough. Since 12/11/12 Denies any other symptoms. Pt states that he was seen a week ago and was prescribed prednisone and neubulizer treatment. But pt is having no relief with use of those medications

## 2012-12-21 NOTE — ED Notes (Signed)
Pt reports sob and NP cough since 12/25. Went to ucc today and they sent to ED for further treatment of bilateral pneumonia. Pt is breathing easily in triage, ambulatory, A&Ox4

## 2012-12-22 LAB — CBC WITH DIFFERENTIAL/PLATELET
Basophils Relative: 0 % (ref 0–1)
Eosinophils Absolute: 0 10*3/uL (ref 0.0–0.7)
Eosinophils Relative: 0 % (ref 0–5)
Hemoglobin: 12.8 g/dL — ABNORMAL LOW (ref 13.0–17.0)
Lymphs Abs: 0.7 10*3/uL (ref 0.7–4.0)
MCH: 31.8 pg (ref 26.0–34.0)
MCHC: 34.5 g/dL (ref 30.0–36.0)
MCV: 92.1 fL (ref 78.0–100.0)
Monocytes Absolute: 1 10*3/uL (ref 0.1–1.0)
Monocytes Relative: 4 % (ref 3–12)
Neutrophils Relative %: 93 % — ABNORMAL HIGH (ref 43–77)
RBC: 4.03 MIL/uL — ABNORMAL LOW (ref 4.22–5.81)

## 2012-12-22 LAB — COMPREHENSIVE METABOLIC PANEL
Albumin: 2.6 g/dL — ABNORMAL LOW (ref 3.5–5.2)
Alkaline Phosphatase: 70 U/L (ref 39–117)
BUN: 11 mg/dL (ref 6–23)
Calcium: 9.1 mg/dL (ref 8.4–10.5)
Creatinine, Ser: 0.91 mg/dL (ref 0.50–1.35)
GFR calc Af Amer: >90 mL/min (ref >90)
Glucose, Bld: 138 mg/dL — ABNORMAL HIGH (ref 70–99)
Potassium: 3.9 mEq/L (ref 3.5–5.1)
Total Protein: 5.8 g/dL — ABNORMAL LOW (ref 6.0–8.3)

## 2012-12-22 LAB — INFLUENZA PANEL BY PCR (TYPE A & B)
H1N1 flu by pcr: NOT DETECTED
Influenza A By PCR: NEGATIVE
Influenza B By PCR: NEGATIVE

## 2012-12-22 LAB — GLUCOSE, CAPILLARY
Glucose-Capillary: 169 mg/dL — ABNORMAL HIGH (ref 70–99)
Glucose-Capillary: 128 mg/dL — ABNORMAL HIGH (ref 70–99)

## 2012-12-22 LAB — HEMOGLOBIN A1C: Hgb A1c MFr Bld: 6.1 % — ABNORMAL HIGH (ref <5.7)

## 2012-12-22 LAB — HIV ANTIBODY (ROUTINE TESTING W REFLEX): HIV: NONREACTIVE

## 2012-12-22 MED ORDER — METHYLPREDNISOLONE SODIUM SUCC 40 MG IJ SOLR
40.0000 mg | Freq: Three times a day (TID) | INTRAMUSCULAR | Status: DC
  Administered 2012-12-22 – 2012-12-23 (×4): 40 mg via INTRAVENOUS
  Filled 2012-12-22 (×6): qty 1

## 2012-12-22 MED ORDER — LEVALBUTEROL HCL 0.63 MG/3ML IN NEBU
0.6300 mg | INHALATION_SOLUTION | Freq: Four times a day (QID) | RESPIRATORY_TRACT | Status: DC
  Administered 2012-12-23 (×2): 0.63 mg via RESPIRATORY_TRACT
  Filled 2012-12-22 (×5): qty 3

## 2012-12-22 MED ORDER — IPRATROPIUM BROMIDE 0.02 % IN SOLN
0.5000 mg | Freq: Four times a day (QID) | RESPIRATORY_TRACT | Status: DC
  Administered 2012-12-23 (×2): 0.5 mg via RESPIRATORY_TRACT
  Filled 2012-12-22 (×2): qty 2.5

## 2012-12-22 NOTE — Progress Notes (Signed)
Pt protocol assessment done. Pt has no previous history other than smoking. Has BBS diminished in bases with a slight exp wheeze in upper airways. Clears after coughing and then just coarse. Changed therapy to QID and continued the Ventolin Q2 prn for as needed throughout the night.

## 2012-12-22 NOTE — Progress Notes (Signed)
PATIENT DETAILS Name: Victor Tran Age: 47 y.o. Sex: male Date of Birth: October 03, 1966 Admit Date: 12/21/2012 Admitting Physician Dewayne Shorter Levora Dredge, MD PCP:No primary provider on file.  Subjective: Feels better, less cough and breath  Assessment/Plan: Principal Problem:  *PNA (pneumonia) - Symptomatically better, less cough and shortness of breath. Also remains afebrile - Leukocytosis worse-but on IV Solu-Medrol - Strep pneumo urinary antigen is positive- likely has underlying streptococcal pneumonia - Continue with intravenous Levaquin - Stop Tamiflu if influenza PCR comes back negative  Active Problems:  Reactive airway disease with wheezing- likely has acute exacerbation of asthma - Did have childhood asthma - Still with coarse rhonchi in all lung zones - Continue with nebulized bronchodilators - Decrease IV Solu-Medrol today - Start incentive spirometry and flutter valve - Encourage ambulation  Sinus tachycardia - This was noted yesterday- and was likely secondary to albuterol nebulizer - This has now resolved - D. dimer was within normal limits - Since this has resolved, no further workup will be pursued  Disposition: Remain inpatient  DVT Prophylaxis: Prophylactic Lovenox   Code Status: Full code   Procedures:  None  CONSULTS:  None  PHYSICAL EXAM: Vital signs in last 24 hours: Filed Vitals:   12/21/12 2203 12/22/12 0219 12/22/12 0629 12/22/12 0759  BP:   123/75   Pulse:   85   Temp:   97.9 F (36.6 C)   TempSrc:   Oral   Resp:   15   Height:      Weight:      SpO2: 97% 98% 97% 96%    Weight change:  Body mass index is 22.94 kg/(m^2).   Gen Exam: Awake and alert with clear speech.   Neck: Supple, No JVD.   Chest: B/L good air entry, course rhonchi heard in all lung zones.   CVS: S1 S2 Regular, no murmurs.  Abdomen: soft, BS +, non tender, non distended.  Extremities: no edema, lower extremities warm to touch. Neurologic: Non Focal.     Skin: No Rash.   Wounds: N/A.    Intake/Output from previous day:  Intake/Output Summary (Last 24 hours) at 12/22/12 1129 Last data filed at 12/22/12 0500  Gross per 24 hour  Intake      0 ml  Output    900 ml  Net   -900 ml     LAB RESULTS: CBC  Lab 12/22/12 0711 12/21/12 2114 12/21/12 1533 12/16/12 1518  WBC 23.3* 20.1* 15.7* 6.0  HGB 12.8* 14.2 15.0 16.2  HCT 37.1* 40.5 41.9 50.3  PLT 411* 426* 378 --  MCV 92.1 91.4 90.7 99.3*  MCH 31.8 32.1 32.5 32.0*  MCHC 34.5 35.1 35.8 32.2  RDW 12.3 12.3 12.1 --  LYMPHSABS 0.7 -- -- --  MONOABS 1.0 -- -- --  EOSABS 0.0 -- -- --  BASOSABS 0.0 -- -- --  BANDABS -- -- -- --    Chemistries   Lab 12/22/12 0711 12/21/12 2114 12/21/12 1533  NA 138 -- 132*  K 3.9 -- 3.8  CL 105 -- 96  CO2 23 -- 22  GLUCOSE 138* -- 239*  BUN 11 -- 12  CREATININE 0.91 1.06 0.97  CALCIUM 9.1 -- 8.9  MG -- -- --    CBG:  Lab 12/22/12 0742 12/21/12 2148  GLUCAP 169* 244*    GFR Estimated Creatinine Clearance: 105.9 ml/min (by C-G formula based on Cr of 0.91).  Coagulation profile No results found for this basename: INR:5,PROTIME:5 in the last 168  hours  Cardiac Enzymes No results found for this basename: CK:3,CKMB:3,TROPONINI:3,MYOGLOBIN:3 in the last 168 hours  No components found with this basename: POCBNP:3  Basename 12/21/12 1913  DDIMER <0.27   No results found for this basename: HGBA1C:2 in the last 72 hours No results found for this basename: CHOL:2,HDL:2,LDLCALC:2,TRIG:2,CHOLHDL:2,LDLDIRECT:2 in the last 72 hours No results found for this basename: TSH,T4TOTAL,FREET3,T3FREE,THYROIDAB in the last 72 hours No results found for this basename: VITAMINB12:2,FOLATE:2,FERRITIN:2,TIBC:2,IRON:2,RETICCTPCT:2 in the last 72 hours No results found for this basename: LIPASE:2,AMYLASE:2 in the last 72 hours  Urine Studies No results found for this basename:  UACOL:2,UAPR:2,USPG:2,UPH:2,UTP:2,UGL:2,UKET:2,UBIL:2,UHGB:2,UNIT:2,UROB:2,ULEU:2,UEPI:2,UWBC:2,URBC:2,UBAC:2,CAST:2,CRYS:2,UCOM:2,BILUA:2 in the last 72 hours  MICROBIOLOGY: No results found for this or any previous visit (from the past 240 hour(s)).  RADIOLOGY STUDIES/RESULTS: Dg Chest 2 View  12/21/2012  *RADIOLOGY REPORT*  Clinical Data: Cough and shortness of breath  CHEST - 2 VIEW  Comparison: 12/12/2012  Findings: Hyperinflation suggests COPD/emphysema.  There is subtle right middle lobe airspace opacity, new since the prior study.  No pleural effusion.  Heart size normal.  No acute osseous abnormality. Minimal patchy left lower lobe airspace opacities also noted.  IMPRESSION: Right middle lobe airspace disease, which could indicate pneumonia. Follow-up to radiographic resolution is recommended in 4-6 weeks.  Left lower lobe airspace opacity, which could represent atelectasis or pneumonia and also amenable to follow-up.   Original Report Authenticated By: Christiana Pellant, M.D.    Dg Chest 2 View  12/12/2012  *RADIOLOGY REPORT*  Clinical Data: Cough.  Short of breath.  CHEST - 2 VIEW  Comparison: None.  Findings: Upper normal heart size.  Bronchitic changes. Hyperaeration.  Chronic changes at the lung apices.  No consolidation or mass.  IMPRESSION: No active cardiopulmonary disease.   Original Report Authenticated By: Jolaine Click, M.D.     MEDICATIONS: Scheduled Meds:   . chlorpheniramine-HYDROcodone  5 mL Oral Q12H  . enoxaparin (LOVENOX) injection  40 mg Subcutaneous Q24H  . insulin aspart  0-15 Units Subcutaneous TID WC  . insulin aspart  0-5 Units Subcutaneous QHS  . ipratropium  0.5 mg Nebulization Q6H  . levalbuterol  0.63 mg Nebulization Q6H  . levofloxacin (LEVAQUIN) IV  750 mg Intravenous Q24H  . methylPREDNISolone (SOLU-MEDROL) injection  40 mg Intravenous TID  . oseltamivir  75 mg Oral BID  . pantoprazole  40 mg Oral Q1200   Continuous Infusions:   . sodium chloride 125  mL/hr at 12/22/12 0238   PRN Meds:.acetaminophen, albuterol  Antibiotics: Anti-infectives     Start     Dose/Rate Route Frequency Ordered Stop   12/21/12 2200   levofloxacin (LEVAQUIN) IVPB 750 mg        750 mg 100 mL/hr over 90 Minutes Intravenous Every 24 hours 12/21/12 2007 12/26/12 2159   12/21/12 2200   oseltamivir (TAMIFLU) capsule 75 mg        75 mg Oral 2 times daily 12/21/12 2007 12/26/12 2159   12/21/12 1345   cefTRIAXone (ROCEPHIN) 1 g in dextrose 5 % 50 mL IVPB        1 g 100 mL/hr over 30 Minutes Intravenous  Once 12/21/12 1340 12/21/12 1447   12/21/12 1345   azithromycin (ZITHROMAX) 500 mg in dextrose 5 % 250 mL IVPB        500 mg 250 mL/hr over 60 Minutes Intravenous  Once 12/21/12 1340 12/21/12 1606   12/21/12 0000   azithromycin (ZITHROMAX Z-PAK) 250 MG tablet        250 mg Oral Daily 12/21/12  1451             Jeoffrey Massed, MD  [facility] Pager:[phone]  If 7PM-7AM, please contact night-coverage [email or web address] Password [email or web address] 12/22/2012, 11:29 AM   LOS: 1 day

## 2012-12-22 NOTE — Progress Notes (Signed)
Utilization Review Completed.Victor Tran T1/04/2013   

## 2012-12-23 LAB — RESPIRATORY VIRUS PANEL
Influenza A H1: NOT DETECTED
Influenza B: NOT DETECTED
Metapneumovirus: NOT DETECTED
Parainfluenza 2: NOT DETECTED
Parainfluenza 3: NOT DETECTED

## 2012-12-23 LAB — GLUCOSE, CAPILLARY: Glucose-Capillary: 102 mg/dL — ABNORMAL HIGH (ref 70–99)

## 2012-12-23 LAB — CBC
HCT: 40 % (ref 39.0–52.0)
Hemoglobin: 13.4 g/dL (ref 13.0–17.0)
MCH: 31.7 pg (ref 26.0–34.0)
MCHC: 33.5 g/dL (ref 30.0–36.0)
RDW: 12.6 % (ref 11.5–15.5)

## 2012-12-23 LAB — DIFFERENTIAL
Basophils Absolute: 0 10*3/uL (ref 0.0–0.1)
Basophils Relative: 0 % (ref 0–1)
Eosinophils Relative: 0 % (ref 0–5)
Monocytes Absolute: 0.9 10*3/uL (ref 0.1–1.0)
Monocytes Relative: 4 % (ref 3–12)
Neutro Abs: 21.5 10*3/uL — ABNORMAL HIGH (ref 1.7–7.7)

## 2012-12-23 LAB — BASIC METABOLIC PANEL
BUN: 14 mg/dL (ref 6–23)
Calcium: 9.3 mg/dL (ref 8.4–10.5)
GFR calc Af Amer: >90 mL/min (ref >90)
GFR calc non Af Amer: >90 mL/min (ref >90)
Glucose, Bld: 128 mg/dL — ABNORMAL HIGH (ref 70–99)
Potassium: 4.7 mEq/L (ref 3.5–5.1)
Sodium: 140 mEq/L (ref 135–145)

## 2012-12-23 MED ORDER — ALBUTEROL SULFATE HFA 108 (90 BASE) MCG/ACT IN AERS: 2.0000 | INHALATION_SPRAY | RESPIRATORY_TRACT | Status: DC | PRN

## 2012-12-23 MED ORDER — PREDNISONE 10 MG PO TABS: ORAL_TABLET | ORAL | Status: DC

## 2012-12-23 MED ORDER — GUAIFENESIN ER 600 MG PO TB12: 1200.0000 mg | ORAL_TABLET | Freq: Every day | ORAL | Status: DC | PRN

## 2012-12-23 MED ORDER — FLUTICASONE-SALMETEROL 250-50 MCG/DOSE IN AEPB: 1.0000 | INHALATION_SPRAY | Freq: Two times a day (BID) | RESPIRATORY_TRACT | Status: DC

## 2012-12-23 MED ORDER — LEVOFLOXACIN 750 MG PO TABS: 750.0000 mg | ORAL_TABLET | Freq: Every day | ORAL | Status: DC

## 2012-12-23 MED ORDER — HYDROCOD POLST-CHLORPHEN POLST 10-8 MG/5ML PO LQCR: 5.0000 mL | Freq: Two times a day (BID) | ORAL | Status: DC | PRN

## 2012-12-23 NOTE — Progress Notes (Signed)
12/24/2011 1000 NCM provided pt with number for [facility] and encouraged him to call toll free number on insurance card for list of PCP's.  Isidoro Donning RN CCM Case Mgmt phone [phone]

## 2012-12-23 NOTE — Progress Notes (Signed)
Pt being discharged home with prescriptions to follow up with PCP. IV discontinued. Skin intact. Denies pain. Slight productive cough. No complaints. Discharge instructions given, all questions answered. Volunteers called to transport patient out. Will continue to monitor. [place], [employer]

## 2012-12-23 NOTE — Discharge Summary (Signed)
PATIENT DETAILS Name: Victor Tran Age: 47 y.o. Sex: male Date of Birth: [birth year] MRN: [id number]. Admit Date: 12/21/2012 Admitting Physician: Maretta Bees, MD PCP:No primary provider on file.  Recommendations for Outpatient Follow-up:  1. Repeat CBC in the next one week  PRIMARY DISCHARGE DIAGNOSIS:  Principal Problem:  *PNA (pneumonia) Active Problems:  Reactive airway disease with wheezing      PAST MEDICAL HISTORY: Past Medical History  Diagnosis Date  . Allergy   . Asthma     DISCHARGE MEDICATIONS:   Medication List     As of 12/23/2012 12:23 PM    STOP taking these medications         ibuprofen 200 MG tablet   Commonly known as: ADVIL,MOTRIN      TAKE these medications         albuterol 108 (90 BASE) MCG/ACT inhaler   Commonly known as: PROVENTIL HFA;VENTOLIN HFA   Inhale 2 puffs into the lungs every 4 (four) hours as needed for wheezing (cough, shortness of breath or wheezing.).      chlorpheniramine-HYDROcodone 10-8 MG/5ML Lqcr   Commonly known as: TUSSIONEX   Take 5 mLs by mouth every 12 (twelve) hours as needed (cough).      Fluticasone-Salmeterol 250-50 MCG/DOSE Aepb   Commonly known as: ADVAIR   Inhale 1 puff into the lungs 2 (two) times daily.      guaiFENesin 600 MG 12 hr tablet   Commonly known as: MUCINEX   Take 2 tablets (1,200 mg total) by mouth daily as needed. For cough      levofloxacin 750 MG tablet   Commonly known as: LEVAQUIN   Take 1 tablet (750 mg total) by mouth daily.      predniSONE 10 MG tablet   Commonly known as: DELTASONE   Take 4 tablets daily for 2 days, then,  Take 3 tablets daily for 2 days, then,  Take 2 tablets daily for 2 days, then,  Take 1 tablet daily for 1 day and then stop          BRIEF HPI:  See H&P, Labs, Consult and Test reports for all details in brief, patient was admitted for fever and associated cough. He was also complaining of shortness of breath and was wheezing on admission he  did he was initially found to be hypoxic and admitted to the hospitalist service for further evaluation and treatment.  CONSULTATIONS:   None  PERTINENT RADIOLOGIC STUDIES: Dg Chest 2 View  12/21/2012  *RADIOLOGY REPORT*  Clinical Data: Cough and shortness of breath  CHEST - 2 VIEW  Comparison: 12/12/2012  Findings: Hyperinflation suggests COPD/emphysema.  There is subtle right middle lobe airspace opacity, new since the prior study.  No pleural effusion.  Heart size normal.  No acute osseous abnormality. Minimal patchy left lower lobe airspace opacities also noted.  IMPRESSION: Right middle lobe airspace disease, which could indicate pneumonia. Follow-up to radiographic resolution is recommended in 4-6 weeks.  Left lower lobe airspace opacity, which could represent atelectasis or pneumonia and also amenable to follow-up.   Original Report Authenticated By: Christiana Pellant, M.D.    Dg Chest 2 View  12/12/2012  *RADIOLOGY REPORT*  Clinical Data: Cough.  Short of breath.  CHEST - 2 VIEW  Comparison: None.  Findings: Upper normal heart size.  Bronchitic changes. Hyperaeration.  Chronic changes at the lung apices.  No consolidation or mass.  IMPRESSION: No active cardiopulmonary disease.   Original Report Authenticated By: Jolaine Click, M.D.  PERTINENT LAB RESULTS: CBC:  Basename 12/23/12 0717 12/22/12 0711  WBC 23.3* 23.3*  HGB 13.4 12.8*  HCT 40.0 37.1*  PLT 421* 411*   CMET CMP     Component Value Date/Time   NA 140 12/23/2012 0717   K 4.7 12/23/2012 0717   CL 106 12/23/2012 0717   CO2 26 12/23/2012 0717   GLUCOSE 128* 12/23/2012 0717   BUN 14 12/23/2012 0717   CREATININE 0.95 12/23/2012 0717   CALCIUM 9.3 12/23/2012 0717   PROT 5.8* 12/22/2012 0711   ALBUMIN 2.6* 12/22/2012 0711   AST 12 12/22/2012 0711   ALT 14 12/22/2012 0711   ALKPHOS 70 12/22/2012 0711   BILITOT 0.2* 12/22/2012 0711   GFRNONAA >90 12/23/2012 0717   GFRAA >90 12/23/2012 0717    GFR Estimated Creatinine Clearance: 101.4 ml/min (by  C-G formula based on Cr of 0.95). No results found for this basename: LIPASE:2,AMYLASE:2 in the last 72 hours No results found for this basename: CKTOTAL:3,CKMB:3,CKMBINDEX:3,TROPONINI:3 in the last 72 hours No components found with this basename: POCBNP:3  Basename 12/21/12 1913  DDIMER <0.27    Basename 12/22/12 1705  HGBA1C 6.1*   No results found for this basename: CHOL:2,HDL:2,LDLCALC:2,TRIG:2,CHOLHDL:2,LDLDIRECT:2 in the last 72 hours No results found for this basename: TSH,T4TOTAL,FREET3,T3FREE,THYROIDAB in the last 72 hours No results found for this basename: VITAMINB12:2,FOLATE:2,FERRITIN:2,TIBC:2,IRON:2,RETICCTPCT:2 in the last 72 hours Coags: No results found for this basename: PT:2,INR:2 in the last 72 hours Microbiology: Recent Results (from the past 240 hour(s))  CULTURE, EXPECTORATED SPUTUM-ASSESSMENT     Status: Normal   Collection Time   12/22/12  8:35 AM      Component Value Range Status Comment   Specimen Description SPUTUM   Final    Special Requests NONE   Final    Sputum evaluation     Final    Value: MICROSCOPIC FINDINGS SUGGEST THAT THIS SPECIMEN IS NOT REPRESENTATIVE OF LOWER RESPIRATORY SECRETIONS. PLEASE RECOLLECT.     CALLED TO TONI 1245 12/22/12 EHOWARD   Report Status 12/22/2012 FINAL   Final      BRIEF HOSPITAL COURSE:   Principal Problem:  *PNA (pneumonia) - Patient on admission did complain of subjective fever and cough. He was having these symptoms since 12/12/12. He had as an outpatient completed a course of Zithromax. He was on tapering dose of steroids prior to admission. On admission he was found to have pneumonia on his chest x-ray. He was also found to have leukocytosis, but was on steroids prior to admission. In any event, patient was admitted, placed on IV Levaquin. With this he made significant clinical improvement. He has been persistently afebrile since admission. Urine for strep pneumococcal antigen is positive. He still has leukocytosis  although patient is on IV Solu-Medrol during his hospital stay. He is afebrile and clinically much improved, currently it is thought that this leukocytosis is as a result of his steroid use.  Likely acute asthma with exacerbation - She does have a history of childhood asthma. He was wheezing on admission. He was placed on scheduled nebulized bronchodilators, IV Solu-Medrol and IV Levaquin. He was also placed on incentive spirometry and flutter valve. He is clinically significantly better this morning. His lungs are completely clear to auscultation. On admission he was slightly hypoxic and required oxygen to maintain his O2 saturation. Currently his oxygen saturation is around 97% on room air even when ambulating. - On discharge, he will be prescribed Advair. He is to continue using albuterol. He'll be on a  tapering dose of steroids as well. I've asked him to make an appointment with a primary care practitioner of his choice, so that further optimization of this regimen and further workup can be initiated in the outpatient setting. He claims understanding.  Sinus tachycardia  - Patient had evidence of sinus tachycardia on admission, however this is likely secondary to one hour long nebulized albuterol. This is not resolved by itself. D-dimer on admission was negative, it was felt that since he had very low probability for venous thromboembolism, no further workup was initiated.  ? Early diabetes  - Hemoglobin A1c is 6.1. I will place him on diet and exercise regimen. Further monitoring will need to be done in the outpatient setting.   TODAY-DAY OF DISCHARGE:  Subjective:   Victor Tran today has no headache,no chest abdominal pain,no new weakness tingling or numbness, feels much better wants to go home today. He is now ambulating and the room without any major issues.  Objective:   Blood pressure 120/75, pulse 80, temperature 97.6 F (36.4 C), temperature source Oral, resp. rate 20, height 5'  11" (1.803 m), weight 74.6 kg (164 lb 7.4 oz), SpO2 95.00%.  Intake/Output Summary (Last 24 hours) at 12/23/12 1223 Last data filed at 12/23/12 0933  Gross per 24 hour  Intake 3558.33 ml  Output    300 ml  Net 3258.33 ml    Exam Awake Alert, Oriented *3, No new F.N deficits, Normal affect [place].AT,PERRAL Supple Neck,No JVD, No cervical lymphadenopathy appriciated.  Symmetrical Chest wall movement, Good air movement bilaterally, CTAB RRR,No Gallops,Rubs or new Murmurs, No Parasternal Heave +ve B.Sounds, Abd Soft, Non tender, No organomegaly appriciated, No rebound -guarding or rigidity. No Cyanosis, Clubbing or edema, No new Rash or bruise  DISCHARGE CONDITION: Stable  DISPOSITION: HOME  DISCHARGE INSTRUCTIONS:    Activity:  As tolerated   Diet recommendation: Diabetic Diet  Follow-up Information    Follow up with Pcp Not In System. (Follow up with PCP 1 Week. Please review the below resource guide for additional information.  )    Contact information:   [facility]  [phone] Please call number to assist with finding a PCP in your area.        Total Time spent on discharge equals 45 minutes.  SignedJeoffrey Massed 12/23/2012 12:23 PM

## 2012-12-23 NOTE — Progress Notes (Signed)
Removed O2 for an hour. O2 sats at 95% at rest. While walking, O2 sats at 97%. Pt in no distress. Notified Dr. Jerral Ralph.  [place], [employer]

## 2012-12-25 LAB — LEGIONELLA ANTIGEN, URINE: Legionella Antigen, Urine: NEGATIVE

## 2013-11-02 ENCOUNTER — Ambulatory Visit (INDEPENDENT_AMBULATORY_CARE_PROVIDER_SITE_OTHER): Payer: 59 | Admitting: Emergency Medicine

## 2013-11-02 VITALS — BP 138/90 | HR 119 | Temp 99.2°F | Resp 18 | Ht 71.0 in | Wt 158.6 lb

## 2013-11-02 DIAGNOSIS — J209 Acute bronchitis, unspecified: Secondary | ICD-10-CM

## 2013-11-02 DIAGNOSIS — R062 Wheezing: Secondary | ICD-10-CM

## 2013-11-02 MED ORDER — ALBUTEROL SULFATE (2.5 MG/3ML) 0.083% IN NEBU: 2.5000 mg | INHALATION_SOLUTION | RESPIRATORY_TRACT | Status: DC | PRN

## 2013-11-02 MED ORDER — AZITHROMYCIN 250 MG PO TABS: ORAL_TABLET | ORAL | Status: DC

## 2013-11-02 MED ORDER — PSEUDOEPHEDRINE-GUAIFENESIN ER 60-600 MG PO TB12: 1.0000 | ORAL_TABLET | Freq: Two times a day (BID) | ORAL | Status: AC

## 2013-11-02 MED ORDER — ALBUTEROL SULFATE HFA 108 (90 BASE) MCG/ACT IN AERS: 2.0000 | INHALATION_SPRAY | RESPIRATORY_TRACT | Status: DC | PRN

## 2013-11-02 NOTE — Progress Notes (Signed)
[facility] [address] [place] [address] [phone]  Date:  11/02/2013   Name:  Victor Tran   DOB:  1966-05-13   MRN:  [id number]  PCP:  No PCP Per Patient    Chief Complaint: Generalized Body Aches, Cough and Fever   History of Present Illness:  Victor Tran is a 47 y.o. very pleasant male patient who presents with the following:  Ill two weeks with cough productive of mucopurulent sputum.  Increased wheezing but controlled with nebulizer.  No fever or chills.  Has nasal congestion but denies discharge.  History of recurrent bronchitis.  Only smoking 2-3 cigs a day now.  No nausea or vomiting.  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  No improvement with over the counter medications or other home remedies. Denies other complaint or health concern today.   Patient Active Problem List   Diagnosis Date Noted  . PNA (pneumonia) 12/21/2012  . Reactive airway disease with wheezing 12/21/2012    Past Medical History  Diagnosis Date  . Allergy   . Asthma     Past Surgical History  Procedure Laterality Date  . Eye surgery      History  Substance Use Topics  . Smoking status: Current Every Day Smoker -- 1.00 packs/day    Types: Cigarettes  . Smokeless tobacco: Never Used     Comment: down to two cigarettes a day  . Alcohol Use: 12.6 oz/week    21 Cans of beer per week     Comment: 2-3 cans of beer daily    Family History  Problem Relation Age of Onset  . Diabetes Mother   . Hypertension Mother   . Heart disease Mother   . Stroke Father   . Heart disease Father   . HIV/AIDS Brother   . Cancer Brother 39    Colon Cancer    No Known Allergies  Medication list has been reviewed and updated.  Current Outpatient Prescriptions on File Prior to Visit  Medication Sig Dispense Refill  . guaiFENesin (MUCINEX) 600 MG 12 hr tablet Take 2 tablets (1,200 mg total) by mouth daily as needed. For cough  10 tablet  0  . albuterol  (PROVENTIL HFA;VENTOLIN HFA) 108 (90 BASE) MCG/ACT inhaler Inhale 2 puffs into the lungs every 4 (four) hours as needed for wheezing (cough, shortness of breath or wheezing.).  1 Inhaler  0  . chlorpheniramine-HYDROcodone (TUSSIONEX PENNKINETIC ER) 10-8 MG/5ML LQCR Take 5 mLs by mouth every 12 (twelve) hours as needed (cough).  140 mL  0  . Fluticasone-Salmeterol (ADVAIR DISKUS) 250-50 MCG/DOSE AEPB Inhale 1 puff into the lungs 2 (two) times daily.  60 each  0   No current facility-administered medications on file prior to visit.    Review of Systems:  As per HPI, otherwise negative.    Physical Examination: Filed Vitals:   11/02/13 1246  BP: 138/90  Pulse: 119  Temp: 99.2 F (37.3 C)  Resp: 18   Filed Vitals:   11/02/13 1246  Height: 5\' 11"  (1.803 m)  Weight: 158 lb 9.6 oz (71.94 kg)   Body mass index is 22.13 kg/(m^2). Ideal Body Weight: Weight in (lb) to have BMI = 25: 178.9  GEN: WDWN, NAD, Non-toxic, A & O x 3 HEENT: Atraumatic, Normocephalic. Neck supple. No masses, No LAD. Ears and Nose: No external deformity. CV: RRR, No M/G/R. No JVD. No thrill. No extra heart sounds. PULM: CTA B, diffuse  wheezes, no crackles, rhonchi. No retractions. No resp. distress. No accessory muscle use. ABD: S, NT, ND, +BS. No rebound. No HSM. EXTR: No c/c/e NEURO Normal gait.  PSYCH: Normally interactive. Conversant. Not depressed or anxious appearing.  Calm demeanor.    Assessment and Plan: Exacerbation bronchitis mucinex d Albuterol zpak  Signed,  Phillips Odor, MD

## 2013-11-02 NOTE — Patient Instructions (Signed)
Bronchitis Bronchitis Bronchitis is the body's way of reacting to injury and/or infection (inflammation) of the bronchi. Bronchi are the air tubes that extend from the windpipe into the lungs. If the inflammation becomes severe, it may cause shortness of breath. CAUSES  Inflammation may be caused by:  A virus.  Germs (bacteria).  Dust.  Allergens.  Pollutants and many other irritants. The cells lining the bronchial tree are covered with tiny hairs (cilia). These constantly beat upward, away from the lungs, toward the mouth. This keeps the lungs free of pollutants. When these cells become too irritated and are unable to do their job, mucus begins to develop. This causes the characteristic cough of bronchitis. The cough clears the lungs when the cilia are unable to do their job. Without either of these protective mechanisms, the mucus would settle in the lungs. Then you would develop pneumonia. Smoking is a common cause of bronchitis and can contribute to pneumonia. Stopping this habit is the single most important thing you can do to help yourself. TREATMENT   Your caregiver may prescribe an antibiotic if the cough is caused by bacteria. Also, medicines that open up your airways make it easier to breathe. Your caregiver may also recommend or prescribe an expectorant. It will loosen the mucus to be coughed up. Only take over-the-counter or prescription medicines for pain, discomfort, or fever as directed by your caregiver.  Removing whatever causes the problem (smoking, for example) is critical to preventing the problem from getting worse.  Cough suppressants may be prescribed for relief of cough symptoms.  Inhaled medicines may be prescribed to help with symptoms now and to help prevent problems from returning.  For those with recurrent (chronic) bronchitis, there may be a need for steroid medicines. SEEK IMMEDIATE MEDICAL CARE IF:   During treatment, you develop more pus-like mucus  (purulent sputum).  You have a fever.  You become progressively more ill.  You have increased difficulty breathing, wheezing, or shortness of breath. It is necessary to seek immediate medical care if you are elderly or sick from any other disease. MAKE SURE YOU:   Understand these instructions.  Will watch your condition.  Will get help right away if you are not doing well or get worse. Document Released: 12/04/2005 Document Revised: 08/06/2013 Document Reviewed: 07/29/2013 [facility] Patient Information 2014 [place], [place].

## 2014-04-25 IMAGING — CR DG CHEST 2V
3 series · 3 of 3 positions shown · non-contrast
Comparison: None.

CLINICAL DATA: Cough.  Short of breath.

CHEST - 2 VIEW

[PA (1 of 2)]
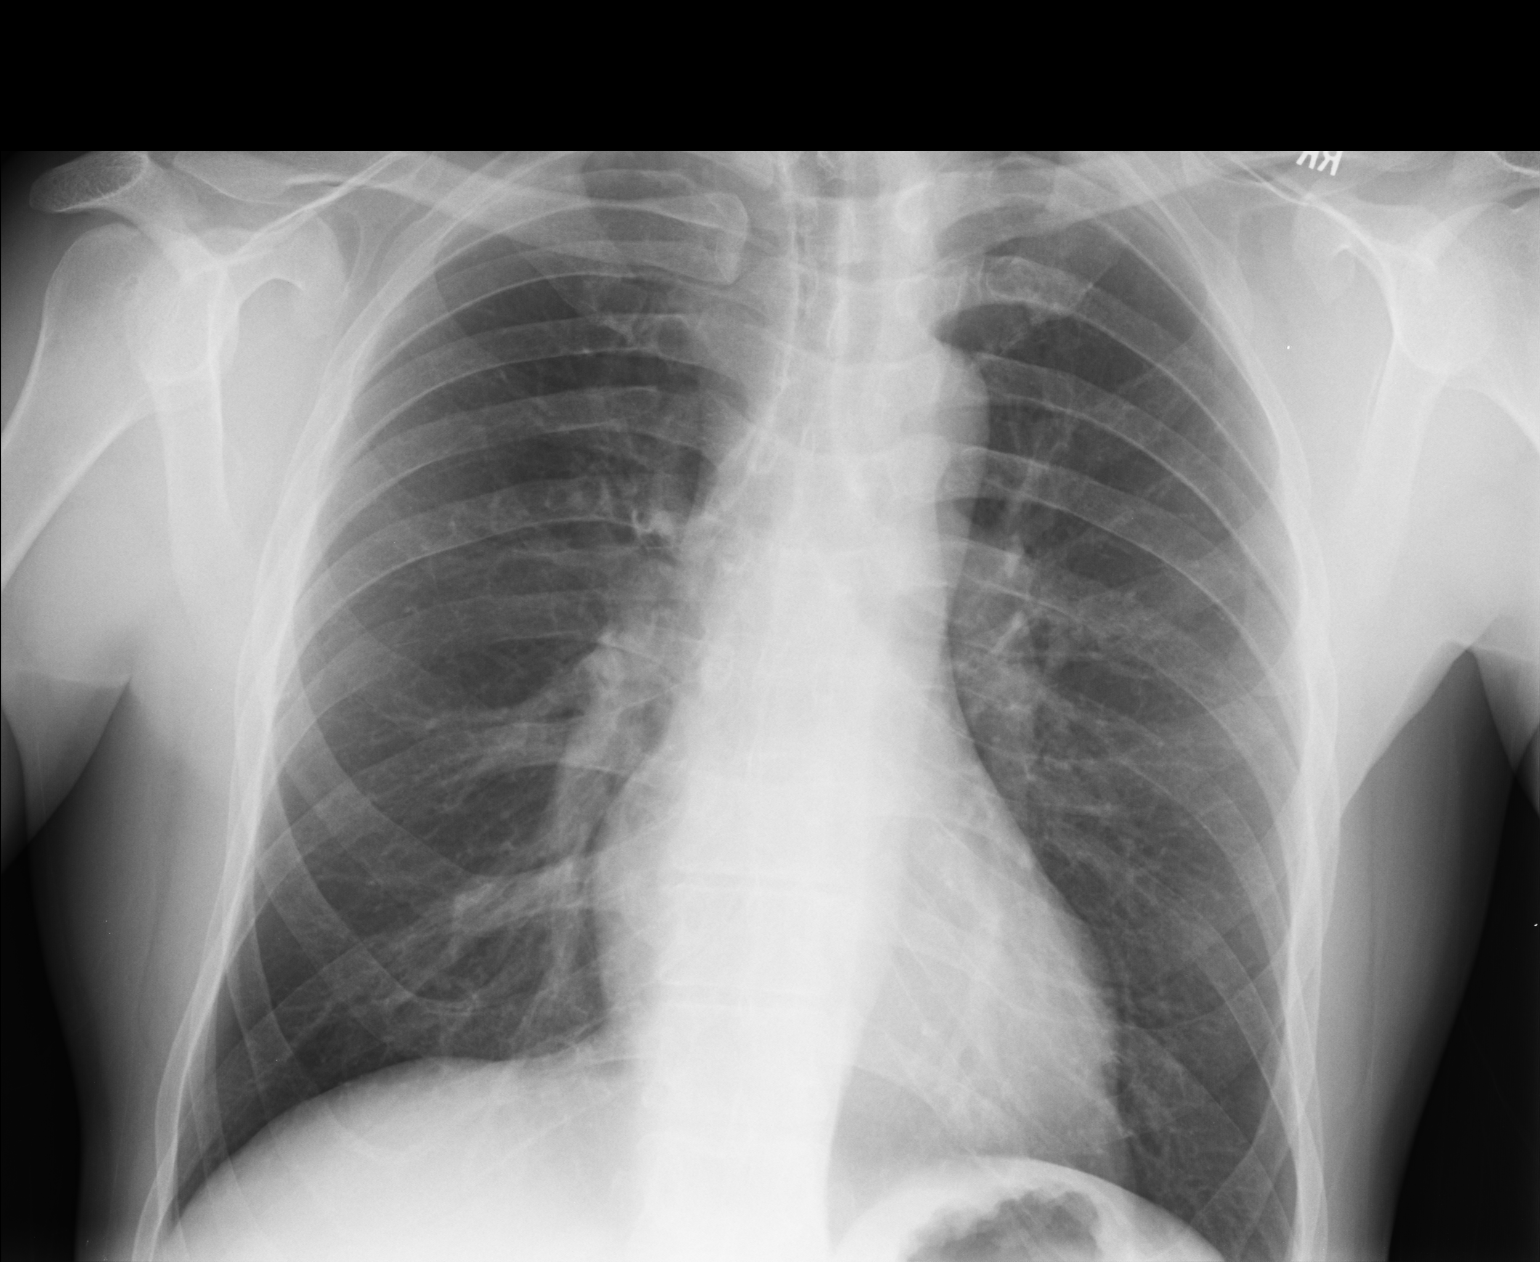

[lateral]
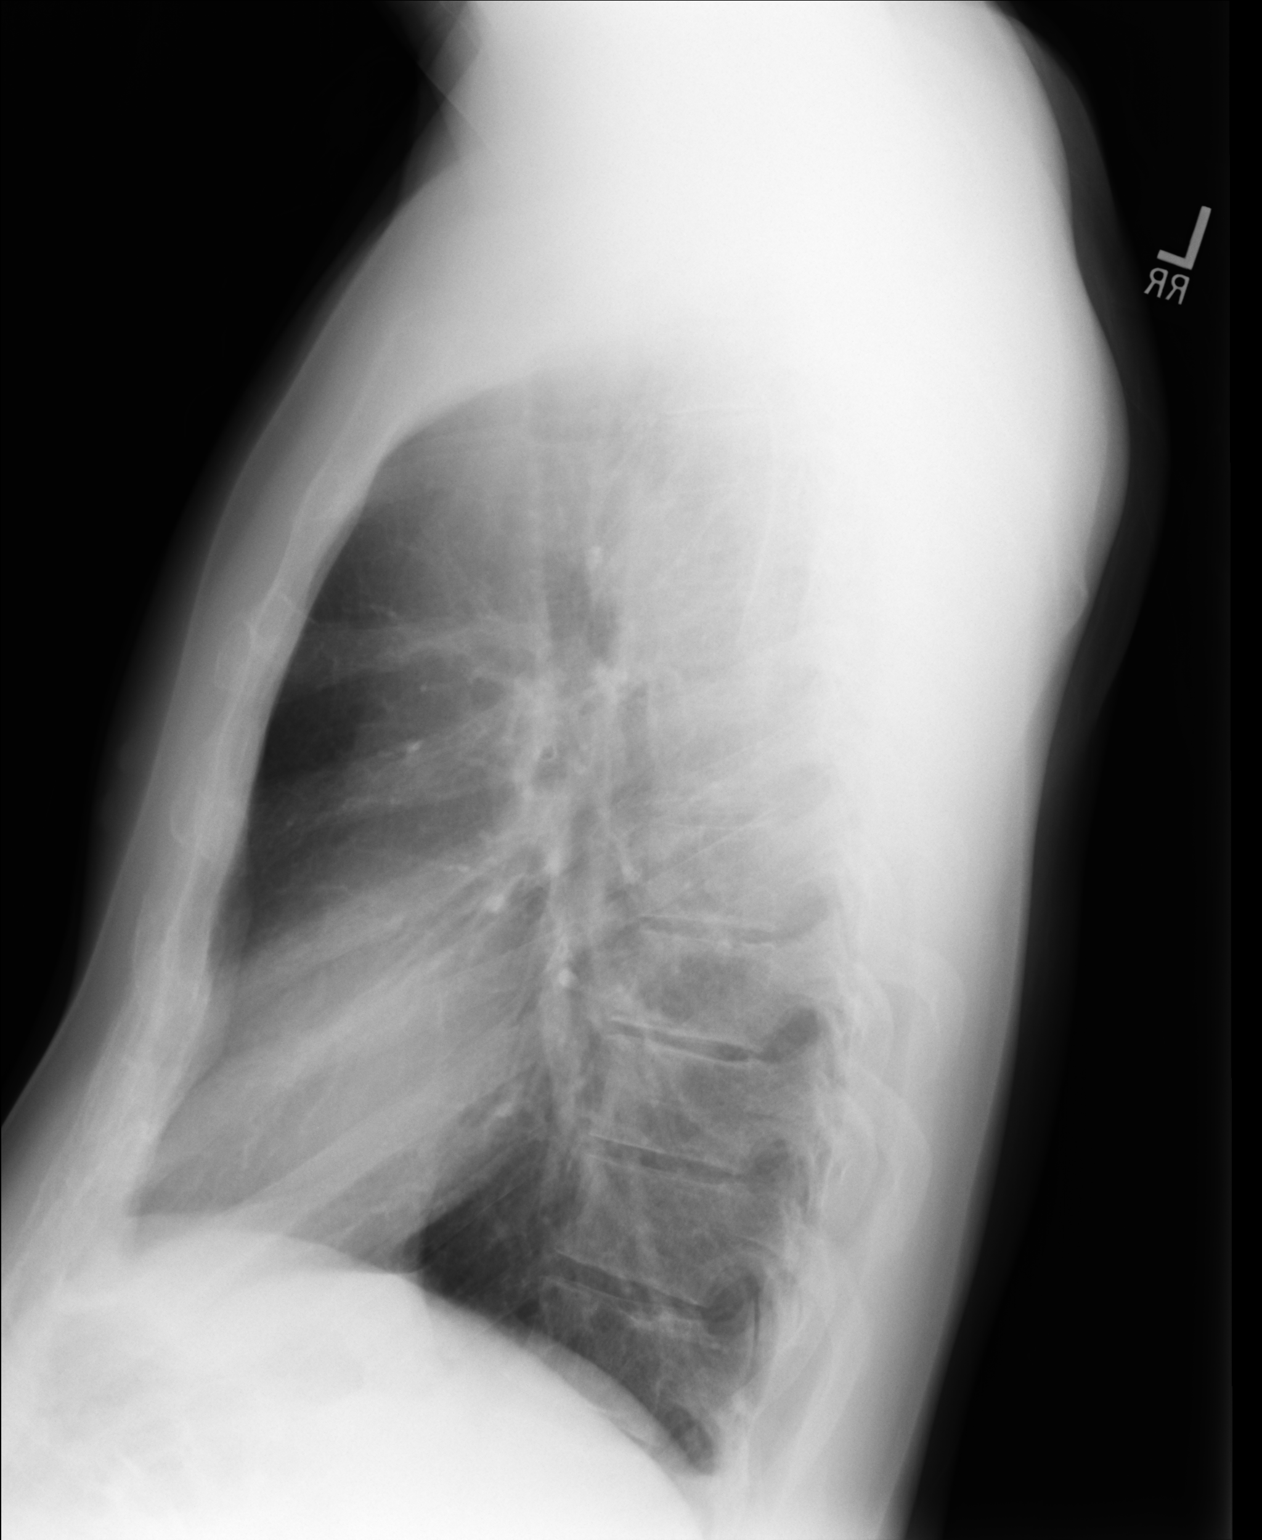

[PA (2 of 2)]
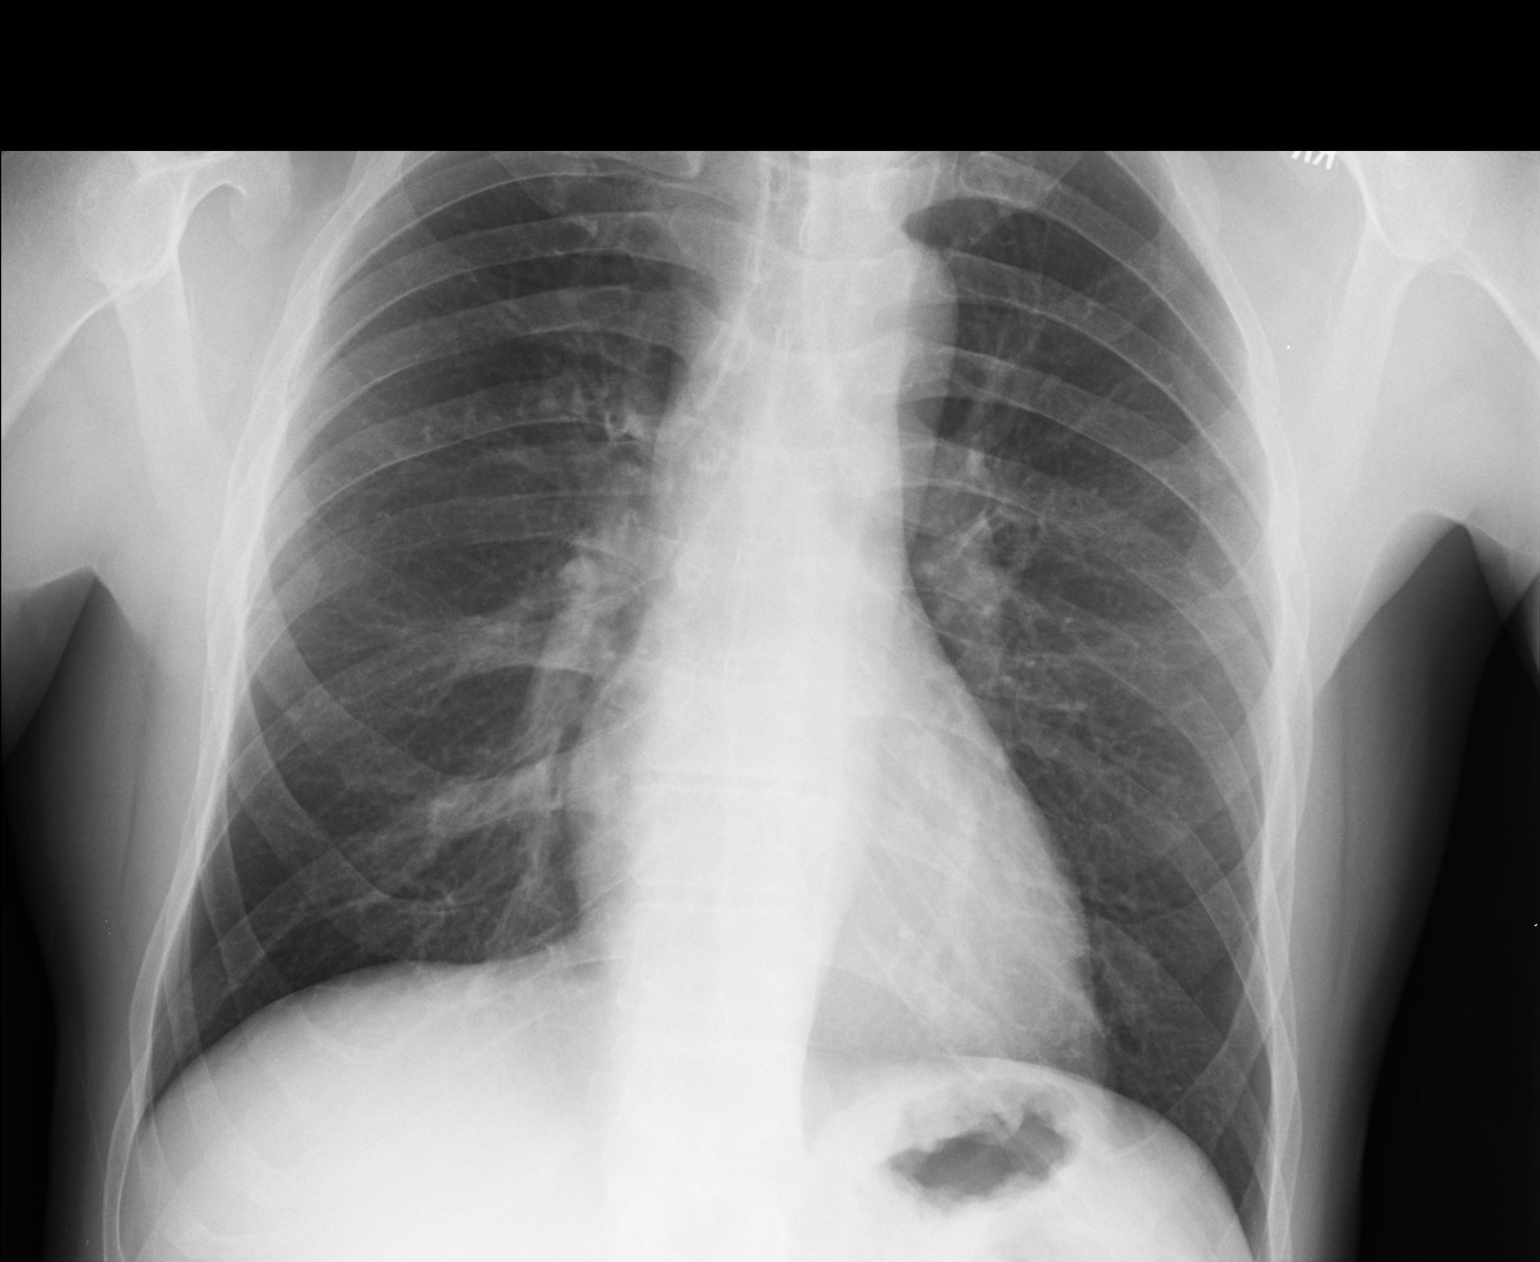

[3 of 3 positions shown; findings below may reference images not displayed]

FINDINGS: Upper normal heart size.  Bronchitic changes.
Hyperaeration.  Chronic changes at the lung apices.  No
consolidation or mass.
IMPRESSION: No active cardiopulmonary disease.

## 2015-01-22 DIAGNOSIS — N201 Calculus of ureter: Secondary | ICD-10-CM | POA: Insufficient documentation

## 2015-01-22 DIAGNOSIS — Z79899 Other long term (current) drug therapy: Secondary | ICD-10-CM | POA: Insufficient documentation

## 2015-01-22 DIAGNOSIS — N211 Calculus in urethra: Secondary | ICD-10-CM | POA: Insufficient documentation

## 2015-01-22 DIAGNOSIS — J45909 Unspecified asthma, uncomplicated: Secondary | ICD-10-CM | POA: Insufficient documentation

## 2015-01-22 DIAGNOSIS — Z7951 Long term (current) use of inhaled steroids: Secondary | ICD-10-CM | POA: Insufficient documentation

## 2015-01-22 DIAGNOSIS — Z720 Tobacco use: Secondary | ICD-10-CM | POA: Insufficient documentation

## 2015-01-23 ENCOUNTER — Encounter (HOSPITAL_COMMUNITY): Payer: Self-pay | Admitting: *Deleted

## 2015-01-23 ENCOUNTER — Emergency Department (HOSPITAL_COMMUNITY)
Admission: EM | Admit: 2015-01-23 | Discharge: 2015-01-23 | Disposition: A | Discharge status: Home / Self Care | Payer: Self-pay | Attending: Emergency Medicine | Admitting: Emergency Medicine

## 2015-01-23 ENCOUNTER — Emergency Department (HOSPITAL_COMMUNITY): Payer: Self-pay

## 2015-01-23 DIAGNOSIS — N211 Calculus in urethra: Secondary | ICD-10-CM

## 2015-01-23 DIAGNOSIS — R350 Frequency of micturition: Secondary | ICD-10-CM

## 2015-01-23 DIAGNOSIS — N201 Calculus of ureter: Secondary | ICD-10-CM

## 2015-01-23 LAB — BASIC METABOLIC PANEL
Anion gap: 4 — ABNORMAL LOW (ref 5–15)
BUN: 9 mg/dL (ref 6–23)
CALCIUM: 9.3 mg/dL (ref 8.4–10.5)
CO2: 27 mmol/L (ref 19–32)
Chloride: 106 mmol/L (ref 96–112)
Creatinine, Ser: 1.01 mg/dL (ref 0.50–1.35)
GFR calc Af Amer: >90 mL/min (ref >90)
GFR calc non Af Amer: 86 mL/min — ABNORMAL LOW (ref >90)
GLUCOSE: 90 mg/dL (ref 70–99)
POTASSIUM: 4.5 mmol/L (ref 3.5–5.1)
Sodium: 137 mmol/L (ref 135–145)

## 2015-01-23 LAB — URINE MICROSCOPIC-ADD ON

## 2015-01-23 LAB — URINALYSIS, ROUTINE W REFLEX MICROSCOPIC
BILIRUBIN URINE: NEGATIVE
Glucose, UA: NEGATIVE mg/dL
Ketones, ur: NEGATIVE mg/dL
Leukocytes, UA: NEGATIVE
Nitrite: NEGATIVE
PROTEIN: NEGATIVE mg/dL
UROBILINOGEN UA: 0.2 mg/dL (ref 0.0–1.0)
pH: 6 (ref 5.0–8.0)

## 2015-01-23 MED ORDER — TAMSULOSIN HCL 0.4 MG PO CAPS: 0.4000 mg | ORAL_CAPSULE | Freq: Every day | ORAL | Status: DC

## 2015-01-23 NOTE — ED Notes (Signed)
Pt states he past a kidney stone 2 days. Pt states now he is having increase urgency w/ little output.

## 2015-01-23 NOTE — ED Notes (Signed)
Pt alert & oriented x4, stable gait. Patient  given discharge instructions, paperwork & prescription(s). Patient verbalized understanding. Pt left department w/ no further questions. 

## 2015-01-23 NOTE — ED Provider Notes (Signed)
CSN: [id number]     Arrival date & time 01/22/15  2348 History   This chart was scribed for Victor Booze, MD by Abel Presto, ED Scribe. This patient was seen in room APA03/APA03 and the patient's care was started at 12:20 AM.    Chief Complaint  Patient presents with  . Urinary Frequency    Patient is a 49 y.o. male presenting with frequency. The history is provided by the patient. No language interpreter was used.  Urinary Frequency    HPI Comments: DORYAN BAHL is a 49 y.o. male with h/o of nephrolithiasis who presents to the Emergency Department complaining of urinary urgency with minimal output and hestiancy.  Pt notes he passed a kidney stone 2 days ago.  Pt denies fever, nausea, vomiting, back pain, flank pain, hematuria, and no changes in urine color.  Pt is not currently seeing a nephrologist.   Past Medical History  Diagnosis Date  . Allergy   . Asthma    Past Surgical History  Procedure Laterality Date  . Eye surgery     Family History  Problem Relation Age of Onset  . Diabetes Mother   . Hypertension Mother   . Heart disease Mother   . Stroke Father   . Heart disease Father   . HIV/AIDS Brother   . Cancer Brother 53    Colon Cancer   History  Substance Use Topics  . Smoking status: Current Every Day Smoker -- 1.00 packs/day    Types: Cigarettes  . Smokeless tobacco: Never Used     Comment: down to two cigarettes a day  . Alcohol Use: 12.6 oz/week    21 Cans of beer per week     Comment: 2-3 cans of beer daily    Review of Systems  Constitutional: Negative for fever.  Gastrointestinal: Negative for nausea and vomiting.  Genitourinary: Positive for urgency and decreased urine volume. Negative for hematuria and flank pain.  Musculoskeletal: Negative for back pain.  All other systems reviewed and are negative.     Allergies  Review of patient's allergies indicates no known allergies.  Home Medications   Prior to Admission medications    Medication Sig Start Date End Date Taking? Authorizing Provider  albuterol (PROVENTIL HFA;VENTOLIN HFA) 108 (90 BASE) MCG/ACT inhaler Inhale 2 puffs into the lungs every 4 (four) hours as needed for wheezing (cough, shortness of breath or wheezing.). 11/02/13   Carmelina Dane, MD  albuterol (PROVENTIL) (2.5 MG/3ML) 0.083% nebulizer solution Take 3 mLs (2.5 mg total) by nebulization every 4 (four) hours as needed for wheezing or shortness of breath. 11/02/13   Carmelina Dane, MD  azithromycin (ZITHROMAX) 250 MG tablet Take 2 tabs PO x 1 dose, then 1 tab PO QD x 4 days 11/02/13   Carmelina Dane, MD  chlorpheniramine-HYDROcodone [facility] PENNKINETIC ER) 10-8 MG/5ML LQCR Take 5 mLs by mouth every 12 (twelve) hours as needed (cough). 12/23/12   Shanker Levora Dredge, MD  Fluticasone-Salmeterol (ADVAIR DISKUS) 250-50 MCG/DOSE AEPB Inhale 1 puff into the lungs 2 (two) times daily. 12/23/12   Shanker Levora Dredge, MD  guaiFENesin (MUCINEX) 600 MG 12 hr tablet Take 2 tablets (1,200 mg total) by mouth daily as needed. For cough 12/23/12   Shanker Levora Dredge, MD   BP 143/106 mmHg  Pulse 71  Temp(Src) 97.8 F (36.6 C) (Oral)  Resp 20  Ht  (1.803 m)  Wt 165 lb (74.844 kg)  BMI 23.02 kg/m2  SpO2 100% Physical  Exam  Constitutional: He is oriented to person, place, and time. He appears well-developed and well-nourished.  HENT:  Head: Normocephalic and atraumatic.  Eyes: EOM are normal. Pupils are equal, round, and reactive to light.  Neck: Normal range of motion. No JVD present.  Cardiovascular: Normal rate, regular rhythm, normal heart sounds and intact distal pulses.   No murmur heard. Pulmonary/Chest: Effort normal and breath sounds normal. He has no wheezes. He has no rales. He exhibits no tenderness.  Abdominal: Soft. Bowel sounds are normal. He exhibits no distension and no mass. There is no tenderness.  Musculoskeletal: Normal range of motion. He exhibits no edema.  Lymphadenopathy:    He  has no cervical adenopathy.  Neurological: He is alert and oriented to person, place, and time. No cranial nerve deficit. Coordination normal.  Skin: Skin is warm and dry. No rash noted.  Psychiatric: He has a normal mood and affect. His behavior is normal. Judgment and thought content normal.  Nursing note and vitals reviewed.   ED Course  Procedures (including critical care time) DIAGNOSTIC STUDIES: Oxygen Saturation is 100% on room air, normal by my interpretation.    COORDINATION OF CARE: 12:25 AM Discussed treatment plan with patient at beside, the patient agrees with the plan and has no further questions at this time.   Labs Review Results for orders placed or performed during the hospital encounter of 01/23/15  Urinalysis, Routine w reflex microscopic  Result Value Ref Range   Color, Urine YELLOW YELLOW   APPearance CLEAR CLEAR   Specific Gravity, Urine <1.005 (L) 1.005 - 1.030   pH 6.0 5.0 - 8.0   Glucose, UA NEGATIVE NEGATIVE mg/dL   Hgb urine dipstick MODERATE (A) NEGATIVE   Bilirubin Urine NEGATIVE NEGATIVE   Ketones, ur NEGATIVE NEGATIVE mg/dL   Protein, ur NEGATIVE NEGATIVE mg/dL   Urobilinogen, UA 0.2 0.0 - 1.0 mg/dL   Nitrite NEGATIVE NEGATIVE   Leukocytes, UA NEGATIVE NEGATIVE  Urine microscopic-add on  Result Value Ref Range   Squamous Epithelial / LPF RARE RARE   WBC, UA 0-2 <3 WBC/hpf   RBC / HPF 3-6 <3 RBC/hpf   Bacteria, UA RARE RARE  Basic metabolic panel  Result Value Ref Range   Sodium 137 135 - 145 mmol/L   Potassium 4.5 3.5 - 5.1 mmol/L   Chloride 106 96 - 112 mmol/L   CO2 27 19 - 32 mmol/L   Glucose, Bld 90 70 - 99 mg/dL   BUN 9 6 - 23 mg/dL   Creatinine, Ser 1.61 0.50 - 1.35 mg/dL   Calcium 9.3 8.4 - 09.6 mg/dL   GFR calc non Af Amer 86 (L) >90 mL/min   GFR calc Af Amer >90 >90 mL/min   Anion gap 4 (L) 5 - 15    Imaging Review Ct Renal Stone Study  01/23/2015   CLINICAL DATA:  Acute Flank pain and dysuria.  EXAM: CT ABDOMEN AND PELVIS  WITHOUT CONTRAST  TECHNIQUE: Multidetector CT imaging of the abdomen and pelvis was performed following the standard protocol without IV contrast.  COMPARISON:  07/24/2007  FINDINGS: Lower chest: The lung bases are clear of acute process. No pleural effusion or pulmonary lesions. The heart is normal in size. No pericardial effusion. The distal esophagus and aorta are unremarkable.  Hepatobiliary: The unenhanced appearance of the liver is unremarkable. No focal hepatic lesions or intrahepatic biliary dilatation. The gallbladder is normal. No common bile duct dilatation.  Pancreas: Normal  Spleen: Normal  Adrenals/Urinary Tract:  The adrenal glands are normal. There are bilateral renal calculi with staghorn type calculus in the lower pole region of the right kidney. The left kidney demonstrates hydronephrosis and there is left hydroureter down to an obstructing irregular 14 x 9 x 6 mm calculus in the distal left ureter at the level of the left acetabulum. No bladder calculi. No right-sided ureteral calculi. There is also a 12 x 9 and mm calcification in the center of the prostate gland which is likely a urethral calculus.  Stomach/Bowel: Grossly normal without contrast. No inflammatory changes, mass lesions or obstructive findings. The terminal ileum is normal.  Vascular/Lymphatic: No mesenteric or retroperitoneal mass or adenopathy. The aorta is normal in caliber.  Other:  Musculoskeletal: No significant bony findings. 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  IMPRESSION: 1. 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 2. 14 x 9 x 6 mm obstructing calculus in the distal left ureter. 3. 12 x 9 mm calculus in the center of the prostate gland is likely in the prostatic urethra.   Electronically Signed   By: Loralie ChampagneMark  Gallerani M.D.   On: 01/23/2015 02:26   Images viewed by me.   MDM   Final diagnoses:  Urinary frequency  Ureterolithiasis  Urethral  calculus    Urinary symptoms suspicious for possible urinary tract infection. History of kidney stones. Urinalysis is sent showing 3-6 RBCs and no WBCs or bacteria. It was decided to send him for CT scan to look for high renal calculi that might account for his symptoms. CT has come back showing a large calculus in the distal left ureter with hydronephrosis and also a large calculus within the prostatic urethra. 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 Presence of staghorn calculus is somewhat unexpected given the fact that the urine is not alkaline and there is no evidence of infection. Case was discussed with Dr. Urban GibsonMani of urology service who recommended checking renal function and, if normal, simple follow-up in his office. Basic metabolic panel is come back normal. He is discharged with prescription for tamsulosin and is instructed to follow-up with urology as soon as possible. Return for fever or pain or nausea.  I personally performed the services described in this documentation, which was scribed in my presence. The recorded information has been reviewed and is accurate.       Victor Boozeavid Aissata Wilmore, MD 01/23/15 [phone]

## 2015-01-23 NOTE — Discharge Instructions (Signed)
Kidney Stones °Kidney stones (urolithiasis) are deposits that form inside your kidneys. The intense pain is caused by the stone moving through the urinary tract. When the stone moves, the ureter goes into spasm around the stone. The stone is usually passed in the urine.  °CAUSES  °· A disorder that makes certain neck glands produce too much parathyroid hormone (primary hyperparathyroidism). °· A buildup of uric acid crystals, similar to gout in your joints. °· Narrowing (stricture) of the ureter. °· A kidney obstruction present at birth (congenital obstruction). °· Previous surgery on the kidney or ureters. °· Numerous kidney infections. °SYMPTOMS  °· Feeling sick to your stomach (nauseous). °· Throwing up (vomiting). °· Blood in the urine (hematuria). °· Pain that usually spreads (radiates) to the groin. °· Frequency or urgency of urination. °DIAGNOSIS  °· Taking a history and physical exam. °· Blood or urine tests. °· CT scan. °· Occasionally, an examination of the inside of the urinary bladder (cystoscopy) is performed. °TREATMENT  °· Observation. °· Increasing your fluid intake. °· Extracorporeal shock wave lithotripsy--This is a noninvasive procedure that uses shock waves to break up kidney stones. °· Surgery may be needed if you have severe pain or persistent obstruction. There are various surgical procedures. Most of the procedures are performed with the use of small instruments. Only small incisions are needed to accommodate these instruments, so recovery time is minimized. °The size, location, and chemical composition are all important variables that will determine the proper choice of action for you. Talk to your health care provider to better understand your situation so that you will minimize the risk of injury to yourself and your kidney.  °HOME CARE INSTRUCTIONS  °· Drink enough water and fluids to keep your urine clear or pale yellow. This will help you to pass the stone or stone fragments. °· Strain  all urine through the provided strainer. Keep all particulate matter and stones for your health care provider to see. The stone causing the pain may be as small as a grain of salt. It is very important to use the strainer each and every time you pass your urine. The collection of your stone will allow your health care provider to analyze it and verify that a stone has actually passed. The stone analysis will often identify what you can do to reduce the incidence of recurrences. °· Only take over-the-counter or prescription medicines for pain, discomfort, or fever as directed by your health care provider. °· Make a follow-up appointment with your health care provider as directed. °· Get follow-up X-rays if required. The absence of pain does not always mean that the stone has passed. It may have only stopped moving. If the urine remains completely obstructed, it can cause loss of kidney function or even complete destruction of the kidney. It is your responsibility to make sure X-rays and follow-ups are completed. Ultrasounds of the kidney can show blockages and the status of the kidney. Ultrasounds are not associated with any radiation and can be performed easily in a matter of minutes. °SEEK MEDICAL CARE IF: °· You experience pain that is progressive and unresponsive to any pain medicine you have been prescribed. °SEEK IMMEDIATE MEDICAL CARE IF:  °· Pain cannot be controlled with the prescribed medicine. °· You have a fever or shaking chills. °· The severity or intensity of pain increases over 18 hours and is not relieved by pain medicine. °· You develop a new onset of abdominal pain. °· You feel faint or pass out. °·   You are unable to urinate. MAKE SURE YOU:   Understand these instructions.  Will watch your condition.  Will get help right away if you are not doing well or get worse. Document Released: 12/04/2005 Document Revised: 08/06/2013 Document Reviewed: 05/07/2013 [facility] Patient Information 2015  [place], [place]. This information is not intended to replace advice given to you by your health care provider. Make sure you discuss any questions you have with your health care provider.  Tamsulosin capsules What is this medicine? TAMSULOSIN (tam SOO loe sin) is used to treat enlargement of the prostate gland in men, a condition called benign prostatic hyperplasia or BPH. It is not for use in women. It works by relaxing muscles in the prostate and bladder neck. This improves urine flow and reduces BPH symptoms. This medicine may be used for other purposes; ask your health care provider or pharmacist if you have questions. COMMON BRAND NAME(S): Flomax What should I tell my health care provider before I take this medicine? They need to know if you have any of the following conditions: -advanced kidney disease -advanced liver disease -low blood pressure -prostate cancer -an unusual or allergic reaction to tamsulosin, sulfa drugs, other medicines, foods, dyes, or preservatives -pregnant or trying to get pregnant -breast-feeding How should I use this medicine? Take this medicine by mouth about 30 minutes after the same meal every day. Follow the directions on the prescription label. Swallow the capsules whole with a glass of water. Do not crush, chew, or open capsules. Do not take your medicine more often than directed. Do not stop taking your medicine unless your doctor tells you to. Talk to your pediatrician regarding the use of this medicine in children. Special care may be needed. Overdosage: If you think you have taken too much of this medicine contact a poison control center or emergency room at once. NOTE: This medicine is only for you. Do not share this medicine with others. What if I miss a dose? If you miss a dose, take it as soon as you can. If it is almost time for your next dose, take only that dose. Do not take double or extra doses. If you stop taking your medicine for several days or  more, ask your doctor or health care professional what dose you should start back on. What may interact with this medicine? -cimetidine -fluoxetine -ketoconazole -medicines for erectile disfunction like sildenafil, tadalafil, vardenafil -medicines for high blood pressure -other alpha-blockers like alfuzosin, doxazosin, phentolamine, phenoxybenzamine, prazosin, terazosin -warfarin This list may not describe all possible interactions. Give your health care provider a list of all the medicines, herbs, non-prescription drugs, or dietary supplements you use. Also tell them if you smoke, drink alcohol, or use illegal drugs. Some items may interact with your medicine. What should I watch for while using this medicine? Visit your doctor or health care professional for regular check ups. You will need lab work done before you start this medicine and regularly while you are taking it. Check your blood pressure as directed. Ask your health care professional what your blood pressure should be, and when you should contact him or her. This medicine may make you feel dizzy or lightheaded. This is more likely to happen after the first dose, after an increase in dose, or during hot weather or exercise. Drinking alcohol and taking some medicines can make this worse. Do not drive, use machinery, or do anything that needs mental alertness until you know how this medicine affects you. Do not  sit or stand up quickly. If you begin to feel dizzy, sit down until you feel better. These effects can decrease once your body adjusts to the medicine. Contact your doctor or health care professional right away if you have an erection that lasts longer than 4 hours or if it becomes painful. This may be a sign of a serious problem and must be treated right away to prevent permanent damage. If you are thinking of having cataract surgery, tell your eye surgeon that you have taken this medicine. What side effects may I notice from  receiving this medicine? Side effects that you should report to your doctor or health care professional as soon as possible: -allergic reactions like skin rash or itching, hives, swelling of the lips, mouth, tongue, or throat -breathing problems -change in vision -feeling faint or lightheaded -irregular heartbeat -prolonged or painful erection -weakness Side effects that usually do not require medical attention (report to your doctor or health care professional if they continue or are bothersome): -back pain -change in sex drive or performance -constipation, nausea or vomiting -cough -drowsy -runny or stuffy nose -trouble sleeping This list may not describe all possible side effects. Call your doctor for medical advice about side effects. You may report side effects to FDA at 1-800-FDA-1088. Where should I keep my medicine? Keep out of the reach of children. Store at room temperature between 15 and 30 degrees C (59 and 86 degrees F). Throw away any unused medicine after the expiration date. NOTE: This sheet is a summary. It may not cover all possible information. If you have questions about this medicine, talk to your doctor, pharmacist, or health care provider.  2015, [employer]. (2012-12-04 14:11:34)

## 2018-03-07 ENCOUNTER — Emergency Department (HOSPITAL_COMMUNITY)
Admission: EM | Admit: 2018-03-07 | Discharge: 2018-03-07 | Disposition: A | Discharge status: Home / Self Care | Payer: BLUE CROSS/BLUE SHIELD | Attending: Emergency Medicine | Admitting: Emergency Medicine

## 2018-03-07 ENCOUNTER — Emergency Department (HOSPITAL_COMMUNITY): Payer: BLUE CROSS/BLUE SHIELD

## 2018-03-07 ENCOUNTER — Other Ambulatory Visit: Payer: Self-pay

## 2018-03-07 ENCOUNTER — Encounter (HOSPITAL_COMMUNITY): Payer: Self-pay | Admitting: Emergency Medicine

## 2018-03-07 DIAGNOSIS — J209 Acute bronchitis, unspecified: Secondary | ICD-10-CM | POA: Diagnosis not present

## 2018-03-07 DIAGNOSIS — Z79899 Other long term (current) drug therapy: Secondary | ICD-10-CM | POA: Diagnosis not present

## 2018-03-07 DIAGNOSIS — R0981 Nasal congestion: Secondary | ICD-10-CM | POA: Insufficient documentation

## 2018-03-07 DIAGNOSIS — R062 Wheezing: Secondary | ICD-10-CM | POA: Insufficient documentation

## 2018-03-07 DIAGNOSIS — R05 Cough: Secondary | ICD-10-CM | POA: Diagnosis present

## 2018-03-07 DIAGNOSIS — R509 Fever, unspecified: Secondary | ICD-10-CM | POA: Insufficient documentation

## 2018-03-07 DIAGNOSIS — R0982 Postnasal drip: Secondary | ICD-10-CM | POA: Insufficient documentation

## 2018-03-07 DIAGNOSIS — J3489 Other specified disorders of nose and nasal sinuses: Secondary | ICD-10-CM | POA: Diagnosis not present

## 2018-03-07 DIAGNOSIS — J45909 Unspecified asthma, uncomplicated: Secondary | ICD-10-CM | POA: Insufficient documentation

## 2018-03-07 DIAGNOSIS — F17210 Nicotine dependence, cigarettes, uncomplicated: Secondary | ICD-10-CM | POA: Insufficient documentation

## 2018-03-07 LAB — RAPID STREP SCREEN (MED CTR MEBANE ONLY): Streptococcus, Group A Screen (Direct): NEGATIVE

## 2018-03-07 MED ORDER — AZITHROMYCIN 250 MG PO TABS: ORAL_TABLET | ORAL | 0 refills | Status: DC

## 2018-03-07 MED ORDER — CETIRIZINE-PSEUDOEPHEDRINE ER 5-120 MG PO TB12: 1.0000 | ORAL_TABLET | Freq: Two times a day (BID) | ORAL | 0 refills | Status: DC

## 2018-03-07 MED ORDER — ALBUTEROL SULFATE HFA 108 (90 BASE) MCG/ACT IN AERS: 1.0000 | INHALATION_SPRAY | Freq: Four times a day (QID) | RESPIRATORY_TRACT | 0 refills | Status: DC | PRN

## 2018-03-07 NOTE — ED Provider Notes (Signed)
[facility] Provider Note   CSN: [id number] Arrival date & time: 03/07/18  1111     History   Chief Complaint Chief Complaint  Patient presents with  . Cough    HPI Victor GrumblingRonald D Tran is a 52 y.o. male presenting  with a 2 day history of uri type symptoms which includes nasal congestion with thick rhinorrhea, dry throat, low grade fever, wheezing,  with productive cough.  He reports one episode of near posttussive emesis.  Symptoms do not include shortness of breath, chest pain,  Nausea, vomiting or diarrhea.  The patient has taken Zyrtec prior to arrival with no significant improvement in symptoms. .  The history is provided by the patient and the spouse.    Past Medical History:  Diagnosis Date  . Allergy   . Asthma     Patient Active Problem List   Diagnosis Date Noted  . PNA (pneumonia) 12/21/2012  . Reactive airway disease with wheezing 12/21/2012    Past Surgical History:  Procedure Laterality Date  . EYE SURGERY         Home Medications    Prior to Admission medications   Medication Sig Start Date End Date Taking? Authorizing Provider  albuterol (PROVENTIL HFA;VENTOLIN HFA) 108 (90 Base) MCG/ACT inhaler Inhale 1-2 puffs into the lungs every 6 (six) hours as needed for wheezing or shortness of breath. 03/07/18   Hoorain Kozakiewicz, Raynelle FanningJulie, PA-C  albuterol (PROVENTIL) (2.5 MG/3ML) 0.083% nebulizer solution Take 3 mLs (2.5 mg total) by nebulization every 4 (four) hours as needed for wheezing or shortness of breath. 11/02/13   Carmelina DaneAnderson, Jeffery S, MD  azithromycin (ZITHROMAX Z-PAK) 250 MG tablet Take 2 tablets by mouth on day one followed by one tablet daily for 4 days. 03/07/18   Burgess AmorIdol, Adelae Yodice, PA-C  cetirizine-pseudoephedrine (ZYRTEC-D) 5-120 MG tablet Take 1 tablet by mouth 2 (two) times daily. 03/07/18   Burgess AmorIdol, Makinsey Pepitone, PA-C  chlorpheniramine-HYDROcodone (TUSSIONEX PENNKINETIC ER) 10-8 MG/5ML LQCR Take 5 mLs by mouth every 12 (twelve) hours as needed (cough).  12/23/12   Ghimire, Werner LeanShanker M, MD  Fluticasone-Salmeterol (ADVAIR DISKUS) 250-50 MCG/DOSE AEPB Inhale 1 puff into the lungs 2 (two) times daily. 12/23/12   Ghimire, Werner LeanShanker M, MD  guaiFENesin (MUCINEX) 600 MG 12 hr tablet Take 2 tablets (1,200 mg total) by mouth daily as needed. For cough 12/23/12   Ghimire, Werner LeanShanker M, MD  tamsulosin (FLOMAX) 0.4 MG CAPS capsule Take 1 capsule (0.4 mg total) by mouth daily. 01/23/15   Dione BoozeGlick, David, MD    Family History Family History  Problem Relation Age of Onset  . Diabetes Mother   . Hypertension Mother   . Heart disease Mother   . Stroke Father   . Heart disease Father   . HIV/AIDS Brother   . Cancer Brother [phone]       Colon Cancer    Social History Social History   Tobacco Use  . Smoking status: Current Every Day Smoker    Packs/day: 1.00    Types: Cigarettes  . Smokeless tobacco: Never Used  . Tobacco comment: down to two cigarettes a day  Substance Use Topics  . Alcohol use: Yes    Alcohol/week: 12.6 oz    Types: 21 Cans of beer per week    Comment: 2-3 cans of beer daily  . Drug use: No     Allergies   Patient has no known allergies.   Review of Systems Review of Systems  Constitutional: Positive for fever. Negative for  chills.  HENT: Positive for congestion, postnasal drip, rhinorrhea and sinus pressure. Negative for ear pain, sore throat, trouble swallowing and voice change.   Eyes: Negative for discharge.  Respiratory: Positive for cough and wheezing. Negative for chest tightness, shortness of breath and stridor.   Cardiovascular: Negative for chest pain.  Gastrointestinal: Negative for abdominal pain, nausea and vomiting.  Genitourinary: Negative.      Physical Exam Updated Vital Signs BP (!) 141/91 (BP Location: Right Arm)   Pulse 94   Temp 100 F (37.8 C) (Oral)   Resp 18   Ht 5\' 11"  (1.803 m)   Wt 74.8 kg (165 lb)   SpO2 99%   BMI 23.01 kg/m   Physical Exam  Constitutional: He is oriented to person, place, and  time. He appears well-developed and well-nourished.  HENT:  Head: Normocephalic and atraumatic.  Right Ear: Tympanic membrane and ear canal normal.  Left Ear: Tympanic membrane and ear canal normal.  Nose: Mucosal edema and rhinorrhea present.  Mouth/Throat: Uvula is midline, oropharynx is clear and moist and mucous membranes are normal. No oropharyngeal exudate, posterior oropharyngeal edema, posterior oropharyngeal erythema or tonsillar abscesses.  Eyes: Conjunctivae are normal.  Neck: Normal range of motion. Neck supple.  Cardiovascular: Normal rate and normal heart sounds.  Pulmonary/Chest: Effort normal. No respiratory distress. He has wheezes in the right lower field. He has no rales.  Expiratory wheeze right lower lung field, clears with cough.  Abdominal: Soft. There is no tenderness.  Musculoskeletal: Normal range of motion.  Neurological: He is alert and oriented to person, place, and time.  Skin: Skin is warm and dry. No rash noted.  Psychiatric: He has a normal mood and affect.     ED Treatments / Results  Labs (all labs ordered are listed, but only abnormal results are displayed) Labs Reviewed  RAPID STREP SCREEN (NOT AT [facility])  CULTURE, GROUP A STREP [facility])    EKG  EKG Interpretation None       Radiology Dg Chest 2 View  Result Date: 03/07/2018 CLINICAL DATA:  Cough short of breath EXAM: CHEST - 2 VIEW COMPARISON:  12/21/2012 FINDINGS: Pulmonary hyperinflation. Lungs are clear without infiltrate effusion or mass. Heart size and vascularity normal. IMPRESSION: Mild pulmonary hyperinflation.  No acute abnormality. Electronically Signed   By: Marlan Palau M.D.   On: 03/07/2018 13:44    Procedures Procedures (including critical care time)  Medications Ordered in ED Medications - No data to display   Initial Impression / Assessment and Plan / ED Course  I have reviewed the triage vital signs and the nursing notes.  Pertinent labs & imaging results that  were available during my care of the patient were reviewed by me and considered in my medical decision making (see chart for details).     Pt with uri type sx, he does endorse seasonal allergies which is Zyrtec has not improved.  He also has a history of pneumonia, bronchitis and is a daily smoker.  Imaging reviewed and discussed with patient.  Exam and symptoms suggesting acute bronchitis.  He will be covered with Zithromax, albuterol.  Switch to Zyrtec-D.  Also discussed rest, increase fluid intake, PRN follow-up for any worsening symptoms.  He has no pneumonia based on his x-ray today.  Final Clinical Impressions(s) / ED Diagnoses   Final diagnoses:  Acute bronchitis, unspecified organism    ED Discharge Orders        Ordered    albuterol (PROVENTIL HFA;VENTOLIN HFA) 108 (  90 Base) MCG/ACT inhaler  Every 6 hours PRN     03/07/18 1451    azithromycin (ZITHROMAX Z-PAK) 250 MG tablet     03/07/18 1451    cetirizine-pseudoephedrine (ZYRTEC-D) 5-120 MG tablet  2 times daily     03/07/18 1451       Burgess Amor, Cordelia Poche 03/07/18 1457    Rolland Porter, MD 03/07/18 [phone]

## 2018-03-07 NOTE — Discharge Instructions (Addendum)
Take the entire course of your antibiotics.  Use your inhaler or your home nebulizer albuterol every 4 hours if you are wheezing or short of breath, but do not take both medications, choose one or the other.  Switch to Zyrtec D as prescribed which will help with nasal congestion as well as drainage.  Rest to make sure you are drinking plenty fluids.

## 2018-03-07 NOTE — ED Triage Notes (Signed)
Pt c/o of cough, congestion and body aches x3 days. No fevers.

## 2018-03-10 LAB — CULTURE, GROUP A STREP (THRC)

## 2018-03-12 ENCOUNTER — Emergency Department (HOSPITAL_COMMUNITY)
Admission: EM | Admit: 2018-03-12 | Discharge: 2018-03-12 | Disposition: A | Discharge status: Home / Self Care | Payer: BLUE CROSS/BLUE SHIELD | Attending: Emergency Medicine | Admitting: Emergency Medicine

## 2018-03-12 ENCOUNTER — Emergency Department (HOSPITAL_COMMUNITY): Payer: BLUE CROSS/BLUE SHIELD

## 2018-03-12 ENCOUNTER — Encounter (HOSPITAL_COMMUNITY): Payer: Self-pay | Admitting: Cardiology

## 2018-03-12 DIAGNOSIS — F17210 Nicotine dependence, cigarettes, uncomplicated: Secondary | ICD-10-CM | POA: Insufficient documentation

## 2018-03-12 DIAGNOSIS — J441 Chronic obstructive pulmonary disease with (acute) exacerbation: Secondary | ICD-10-CM | POA: Diagnosis not present

## 2018-03-12 DIAGNOSIS — Z79899 Other long term (current) drug therapy: Secondary | ICD-10-CM | POA: Diagnosis not present

## 2018-03-12 DIAGNOSIS — J45909 Unspecified asthma, uncomplicated: Secondary | ICD-10-CM | POA: Insufficient documentation

## 2018-03-12 DIAGNOSIS — R0602 Shortness of breath: Secondary | ICD-10-CM | POA: Diagnosis present

## 2018-03-12 LAB — COMPREHENSIVE METABOLIC PANEL
ALT: 27 U/L (ref 17–63)
AST: 36 U/L (ref 15–41)
Albumin: 3.5 g/dL (ref 3.5–5.0)
Alkaline Phosphatase: 54 U/L (ref 38–126)
Anion gap: 13 (ref 5–15)
BILIRUBIN TOTAL: 0.7 mg/dL (ref 0.3–1.2)
BUN: 12 mg/dL (ref 6–20)
CO2: 24 mmol/L (ref 22–32)
CREATININE: 1.01 mg/dL (ref 0.61–1.24)
Calcium: 9.2 mg/dL (ref 8.9–10.3)
Chloride: 99 mmol/L — ABNORMAL LOW (ref 101–111)
Glucose, Bld: 104 mg/dL — ABNORMAL HIGH (ref 65–99)
Potassium: 4.4 mmol/L (ref 3.5–5.1)
Sodium: 136 mmol/L (ref 135–145)
Total Protein: 7.2 g/dL (ref 6.5–8.1)

## 2018-03-12 LAB — CBC WITH DIFFERENTIAL/PLATELET
BASOS PCT: 0 %
Basophils Absolute: 0 10*3/uL (ref 0.0–0.1)
Eosinophils Absolute: 0.2 10*3/uL (ref 0.0–0.7)
Eosinophils Relative: 2 %
HCT: 40.8 % (ref 39.0–52.0)
Hemoglobin: 13.6 g/dL (ref 13.0–17.0)
Lymphocytes Relative: 11 %
Lymphs Abs: 1.1 10*3/uL (ref 0.7–4.0)
MCH: 31.1 pg (ref 26.0–34.0)
MCHC: 33.3 g/dL (ref 30.0–36.0)
MCV: 93.2 fL (ref 78.0–100.0)
MONOS PCT: 9 %
Monocytes Absolute: 0.9 10*3/uL (ref 0.1–1.0)
NEUTROS PCT: 78 %
Neutro Abs: 7.8 10*3/uL — ABNORMAL HIGH (ref 1.7–7.7)
Platelets: 243 10*3/uL (ref 150–400)
RBC: 4.38 MIL/uL (ref 4.22–5.81)
RDW: 11.9 % (ref 11.5–15.5)
WBC: 10.1 10*3/uL (ref 4.0–10.5)

## 2018-03-12 MED ORDER — PREDNISONE 10 MG PO TABS: 20.0000 mg | ORAL_TABLET | Freq: Every day | ORAL | 0 refills | Status: DC

## 2018-03-12 MED ORDER — ALBUTEROL SULFATE (2.5 MG/3ML) 0.083% IN NEBU
2.5000 mg | INHALATION_SOLUTION | Freq: Once | RESPIRATORY_TRACT | Status: AC
  Administered 2018-03-12: 2.5 mg via RESPIRATORY_TRACT
  Filled 2018-03-12: qty 3

## 2018-03-12 MED ORDER — IPRATROPIUM-ALBUTEROL 0.5-2.5 (3) MG/3ML IN SOLN
3.0000 mL | Freq: Once | RESPIRATORY_TRACT | Status: AC
  Administered 2018-03-12: 3 mL via RESPIRATORY_TRACT
  Filled 2018-03-12: qty 3

## 2018-03-12 MED ORDER — MAGNESIUM SULFATE 2 GM/50ML IV SOLN
2.0000 g | Freq: Once | INTRAVENOUS | Status: AC
  Administered 2018-03-12: 2 g via INTRAVENOUS
  Filled 2018-03-12: qty 50

## 2018-03-12 MED ORDER — METHYLPREDNISOLONE SODIUM SUCC 125 MG IJ SOLR
125.0000 mg | Freq: Once | INTRAMUSCULAR | Status: AC
  Administered 2018-03-12: 125 mg via INTRAVENOUS
  Filled 2018-03-12: qty 2

## 2018-03-12 NOTE — Discharge Instructions (Addendum)
Follow up soon if not [place]

## 2018-03-12 NOTE — ED Triage Notes (Signed)
Pt c/o cough and sob.  Seen here with same last Thursday.  Finished antibiotics.

## 2018-03-12 NOTE — ED Provider Notes (Signed)
[facility] Provider Note   CSN: [id number] Arrival date & time: 03/12/18  1017     History   Chief Complaint Chief Complaint  Patient presents with  . Shortness of Breath    HPI Victor Tran is a 52 y.o. male.  Pt complains of sob  The history is provided by the patient. No language interpreter was used.  Shortness of Breath  This is a new problem. The problem occurs continuously.The current episode started more than 2 days ago. The problem has not changed since onset.Associated symptoms include wheezing. Pertinent negatives include no headaches, no ear pain, no cough, no chest pain, no abdominal pain and no rash. The problem's precipitants include pollens. Risk factors include smoking. The treatment provided mild relief. He has had prior hospitalizations. Associated medical issues include asthma and COPD.    Past Medical History:  Diagnosis Date  . Allergy   . Asthma     Patient Active Problem List   Diagnosis Date Noted  . PNA (pneumonia) 12/21/2012  . Reactive airway disease with wheezing 12/21/2012    Past Surgical History:  Procedure Laterality Date  . EYE SURGERY          Home Medications    Prior to Admission medications   Medication Sig Start Date End Date Taking? Authorizing Provider  albuterol (PROVENTIL HFA;VENTOLIN HFA) 108 (90 Base) MCG/ACT inhaler Inhale 1-2 puffs into the lungs every 6 (six) hours as needed for wheezing or shortness of breath. 03/07/18  Yes Idol, Raynelle FanningJulie, PA-C  albuterol (PROVENTIL) (2.5 MG/3ML) 0.083% nebulizer solution Take 3 mLs (2.5 mg total) by nebulization every 4 (four) hours as needed for wheezing or shortness of breath. 11/02/13  Yes Carmelina DaneAnderson, Jeffery S, MD  azithromycin (ZITHROMAX Z-PAK) 250 MG tablet Take 2 tablets by mouth on day one followed by one tablet daily for 4 days. 03/07/18  Yes Idol, Raynelle FanningJulie, PA-C  cetirizine-pseudoephedrine (ZYRTEC-D) 5-120 MG tablet Take 1 tablet by mouth 2 (two) times  daily. 03/07/18  Yes Idol, Raynelle FanningJulie, PA-C  Fluticasone-Salmeterol (ADVAIR DISKUS) 250-50 MCG/DOSE AEPB Inhale 1 puff into the lungs 2 (two) times daily. 12/23/12  Yes Ghimire, Werner LeanShanker M, MD  guaiFENesin (MUCINEX) 600 MG 12 hr tablet Take 2 tablets (1,200 mg total) by mouth daily as needed. For cough 12/23/12  Yes Ghimire, Werner LeanShanker M, MD  ibuprofen (ADVIL,MOTRIN) 100 MG tablet Take 600 mg by mouth every 6 (six) hours as needed for fever.   Yes [provider]  predniSONE (DELTASONE) 10 MG tablet Take 2 tablets (20 mg total) by mouth daily. 03/12/18   Bethann BerkshireZammit, Sherman Paone, MD    Family History Family History  Problem Relation Age of Onset  . Diabetes Mother   . Hypertension Mother   . Heart disease Mother   . Stroke Father   . Heart disease Father   . HIV/AIDS Brother   . Cancer Brother [phone]       Colon Cancer    Social History Social History   Tobacco Use  . Smoking status: Current Every Day Smoker    Packs/day: 1.00    Types: Cigarettes  . Smokeless tobacco: Never Used  . Tobacco comment: down to two cigarettes a day  Substance Use Topics  . Alcohol use: Yes    Alcohol/week: 12.6 oz    Types: 21 Cans of beer per week    Comment: 2-3 cans of beer daily  . Drug use: No     Allergies   Patient has no known  allergies.   Review of Systems Review of Systems  Constitutional: Negative for appetite change and fatigue.  HENT: Negative for congestion, ear discharge, ear pain and sinus pressure.   Eyes: Negative for discharge.  Respiratory: Positive for shortness of breath and wheezing. Negative for cough.   Cardiovascular: Negative for chest pain.  Gastrointestinal: Negative for abdominal pain and diarrhea.  Genitourinary: Negative for frequency and hematuria.  Musculoskeletal: Negative for back pain.  Skin: Negative for rash.  Neurological: Negative for seizures and headaches.  Psychiatric/Behavioral: Negative for hallucinations.     Physical Exam Updated Vital Signs BP  116/80   Pulse 74   Temp 98.2 F (36.8 C) (Oral)   Resp 14   Ht 5\' 11"  (1.803 m)   Wt 74.8 kg (165 lb)   SpO2 96%   BMI 23.01 kg/m   Physical Exam  Constitutional: He is oriented to person, place, and time. He appears well-developed.  HENT:  Head: Normocephalic.  Eyes: Conjunctivae and EOM are normal. No scleral icterus.  Neck: Neck supple. No tracheal deviation present. No thyromegaly present.  Cardiovascular: Normal rate and regular rhythm. Exam reveals no gallop and no friction rub.  No murmur heard. Pulmonary/Chest: No stridor. He has wheezes. He has no rales. He exhibits no tenderness.  Abdominal: He exhibits no distension. There is no tenderness. There is no rebound.  Musculoskeletal: Normal range of motion. He exhibits no edema.  Lymphadenopathy:    He has no cervical adenopathy.  Neurological: He is oriented to person, place, and time. He exhibits normal muscle tone. Coordination normal.  Skin: Skin is warm. No rash noted. No erythema.  Psychiatric: He has a normal mood and affect. His behavior is normal.     ED Treatments / Results  Labs (all labs ordered are listed, but only abnormal results are displayed) Labs Reviewed  CBC WITH DIFFERENTIAL/PLATELET - Abnormal; Notable for the following components:      Result Value   Neutro Abs 7.8 (*)    All other components within normal limits  COMPREHENSIVE METABOLIC PANEL - Abnormal; Notable for the following components:   Chloride 99 (*)    Glucose, Bld 104 (*)    All other components within normal limits    EKG None  Radiology Dg Chest 2 View  Result Date: 03/12/2018 CLINICAL DATA:  Cough and shortness of breath. Patient was seen in the emergency department 5 days ago and diagnosed with bronchitis. History of asthma, current smoker. EXAM: CHEST - 2 VIEW COMPARISON:  PA and lateral chest x-ray of March 07, 2018 FINDINGS: The lungs remain hyperinflated. The interstitial markings are slightly more conspicuous  bilaterally. There is no alveolar infiltrate or pleural effusion. The heart and pulmonary vascularity are normal. The mediastinum is normal in width. The bony thorax exhibits no acute abnormality. IMPRESSION: Hyperinflation consistent with known reactive airway disease. Mild increased prominence of the perihilar lung markings likely reflects acute bronchitis. No alveolar pneumonia. Electronically Signed   By: David  [place] M.D.   On: 03/12/2018 11:02    Procedures Procedures (including critical care time)  Medications Ordered in ED Medications  methylPREDNISolone sodium succinate (SOLU-MEDROL) 125 mg/2 mL injection 125 mg (125 mg Intravenous Given 03/12/18 1450)  magnesium sulfate IVPB 2 g 50 mL (0 g Intravenous Stopped 03/12/18 1635)  ipratropium-albuterol (DUONEB) 0.5-2.5 (3) MG/3ML nebulizer solution 3 mL (3 mLs Nebulization Given 03/12/18 1553)  albuterol (PROVENTIL) (2.5 MG/3ML) 0.083% nebulizer solution 2.5 mg (2.5 mg Nebulization Given 03/12/18 1553)  ipratropium-albuterol (DUONEB) 0.5-2.5 (  3) MG/3ML nebulizer solution 3 mL (3 mLs Nebulization Given 03/12/18 1657)  albuterol (PROVENTIL) (2.5 MG/3ML) 0.083% nebulizer solution 2.5 mg (2.5 mg Nebulization Given 03/12/18 1657)     Initial Impression / Assessment and Plan / ED Course  I have reviewed the triage vital signs and the nursing notes.  Pertinent labs & imaging results that were available during my care of the patient were reviewed by me and considered in my medical decision making (see chart for details).     Pt with copd exacerbation.  Pt improved with tx.  Pt put on prednisone  Final Clinical Impressions(s) / ED Diagnoses   Final diagnoses:  COPD exacerbation [facility])    ED Discharge Orders        Ordered    predniSONE (DELTASONE) 10 MG tablet  Daily     03/12/18 1718       Bethann Berkshire, MD 03/12/18 1723

## 2018-04-29 ENCOUNTER — Ambulatory Visit: Payer: BLUE CROSS/BLUE SHIELD | Admitting: Family Medicine

## 2018-07-04 ENCOUNTER — Emergency Department (HOSPITAL_COMMUNITY)
Admission: EM | Admit: 2018-07-04 | Discharge: 2018-07-04 | Disposition: A | Discharge status: Home / Self Care | Payer: BLUE CROSS/BLUE SHIELD | Attending: Emergency Medicine | Admitting: Emergency Medicine

## 2018-07-04 ENCOUNTER — Emergency Department (HOSPITAL_COMMUNITY): Payer: BLUE CROSS/BLUE SHIELD

## 2018-07-04 ENCOUNTER — Encounter (HOSPITAL_COMMUNITY): Payer: Self-pay | Admitting: Emergency Medicine

## 2018-07-04 ENCOUNTER — Other Ambulatory Visit: Payer: Self-pay

## 2018-07-04 DIAGNOSIS — R1031 Right lower quadrant pain: Secondary | ICD-10-CM

## 2018-07-04 DIAGNOSIS — F17210 Nicotine dependence, cigarettes, uncomplicated: Secondary | ICD-10-CM | POA: Insufficient documentation

## 2018-07-04 DIAGNOSIS — Z79899 Other long term (current) drug therapy: Secondary | ICD-10-CM | POA: Insufficient documentation

## 2018-07-04 DIAGNOSIS — J45909 Unspecified asthma, uncomplicated: Secondary | ICD-10-CM | POA: Insufficient documentation

## 2018-07-04 DIAGNOSIS — R109 Unspecified abdominal pain: Secondary | ICD-10-CM | POA: Diagnosis present

## 2018-07-04 LAB — COMPREHENSIVE METABOLIC PANEL
ALBUMIN: 4.1 g/dL (ref 3.5–5.0)
ALK PHOS: 41 U/L (ref 38–126)
ALT: 16 U/L (ref 0–44)
AST: 19 U/L (ref 15–41)
Anion gap: 7 (ref 5–15)
BILIRUBIN TOTAL: 0.7 mg/dL (ref 0.3–1.2)
BUN: 12 mg/dL (ref 6–20)
CO2: 25 mmol/L (ref 22–32)
Calcium: 8.9 mg/dL (ref 8.9–10.3)
Chloride: 104 mmol/L (ref 98–111)
Creatinine, Ser: 0.96 mg/dL (ref 0.61–1.24)
GFR calc Af Amer: >60 mL/min (ref >60)
GLUCOSE: 89 mg/dL (ref 70–99)
Potassium: 3.9 mmol/L (ref 3.5–5.1)
Sodium: 136 mmol/L (ref 135–145)
TOTAL PROTEIN: 6.7 g/dL (ref 6.5–8.1)

## 2018-07-04 LAB — URINALYSIS, ROUTINE W REFLEX MICROSCOPIC
Bilirubin Urine: NEGATIVE
Glucose, UA: NEGATIVE mg/dL
KETONES UR: NEGATIVE mg/dL
LEUKOCYTES UA: NEGATIVE
Nitrite: NEGATIVE
PROTEIN: NEGATIVE mg/dL
Specific Gravity, Urine: 1.006 (ref 1.005–1.030)
pH: 5 (ref 5.0–8.0)

## 2018-07-04 LAB — CBC WITH DIFFERENTIAL/PLATELET
Basophils Absolute: 0 10*3/uL (ref 0.0–0.1)
Basophils Relative: 1 %
Eosinophils Absolute: 0.2 10*3/uL (ref 0.0–0.7)
Eosinophils Relative: 4 %
HEMATOCRIT: 42.6 % (ref 39.0–52.0)
Hemoglobin: 14.4 g/dL (ref 13.0–17.0)
Lymphocytes Relative: 24 %
Lymphs Abs: 1.5 10*3/uL (ref 0.7–4.0)
MCH: 31.4 pg (ref 26.0–34.0)
MCHC: 33.8 g/dL (ref 30.0–36.0)
MCV: 93 fL (ref 78.0–100.0)
MONO ABS: 0.5 10*3/uL (ref 0.1–1.0)
Monocytes Relative: 8 %
Neutro Abs: 4 10*3/uL (ref 1.7–7.7)
Neutrophils Relative %: 63 %
Platelets: 198 10*3/uL (ref 150–400)
RBC: 4.58 MIL/uL (ref 4.22–5.81)
RDW: 12.5 % (ref 11.5–15.5)
WBC: 6.4 10*3/uL (ref 4.0–10.5)

## 2018-07-04 MED ORDER — IOPAMIDOL (ISOVUE-300) INJECTION 61%
100.0000 mL | Freq: Once | INTRAVENOUS | Status: AC | PRN
  Administered 2018-07-04: 100 mL via INTRAVENOUS

## 2018-07-04 NOTE — ED Triage Notes (Signed)
  RLQ pain since last night

## 2018-07-04 NOTE — ED Provider Notes (Signed)
[facility] EMERGENCY DEPARTMENT Provider Note   CSN: [id number] Arrival date & time: 07/04/18  1137     History   Chief Complaint Chief Complaint  Patient presents with  . Abdominal Pain    HPI Pauline Trainer is a 52 y.o. male.  HPI  52 year old male presents with right lower quadrant abdominal pain.  Started last night sometime after work around 8 PM.  It is a dull, nagging pain that is about a 4 or 5 out of 10.  He took 3 Aleve this morning with minimal relief.  When he stands up he seems to notice it being a little worse.  He ate breakfast this morning but did not seem to cause any change in the pain.  He has some chronic back pain but no new back pain or flank pain.  Pain does not radiate.  There is no fever, nausea, vomiting, diarrhea or dysuria.  He felt like his urine was a little dark when he urinated just prior to arrival.  Has had kidney stones before but this feels different.  Past Medical History:  Diagnosis Date  . Allergy   . Asthma     Patient Active Problem List   Diagnosis Date Noted  . PNA (pneumonia) 12/21/2012  . Reactive airway disease with wheezing 12/21/2012    Past Surgical History:  Procedure Laterality Date  . EYE SURGERY          Home Medications    Prior to Admission medications   Medication Sig Start Date End Date Taking? Authorizing Provider  albuterol (PROVENTIL HFA;VENTOLIN HFA) 108 (90 Base) MCG/ACT inhaler Inhale 1-2 puffs into the lungs every 6 (six) hours as needed for wheezing or shortness of breath. 03/07/18   Idol, Raynelle Fanning, PA-C  albuterol (PROVENTIL) (2.5 MG/3ML) 0.083% nebulizer solution Take 3 mLs (2.5 mg total) by nebulization every 4 (four) hours as needed for wheezing or shortness of breath. 11/02/13   Carmelina Dane, MD  azithromycin (ZITHROMAX Z-PAK) 250 MG tablet Take 2 tablets by mouth on day one followed by one tablet daily for 4 days. 03/07/18   Burgess Amor, PA-C  cetirizine-pseudoephedrine (ZYRTEC-D) 5-120 MG tablet  Take 1 tablet by mouth 2 (two) times daily. 03/07/18   Burgess Amor, PA-C  Fluticasone-Salmeterol (ADVAIR DISKUS) 250-50 MCG/DOSE AEPB Inhale 1 puff into the lungs 2 (two) times daily. 12/23/12   Ghimire, Werner Lean, MD  guaiFENesin (MUCINEX) 600 MG 12 hr tablet Take 2 tablets (1,200 mg total) by mouth daily as needed. For cough 12/23/12   Ghimire, Werner Lean, MD  ibuprofen (ADVIL,MOTRIN) 100 MG tablet Take 600 mg by mouth every 6 (six) hours as needed for fever.    [provider]  predniSONE (DELTASONE) 10 MG tablet Take 2 tablets (20 mg total) by mouth daily. 03/12/18   Bethann Berkshire, MD    Family History Family History  Problem Relation Age of Onset  . Diabetes Mother   . Hypertension Mother   . Heart disease Mother   . Stroke Father   . Heart disease Father   . HIV/AIDS Brother   . Cancer Brother 45       Colon Cancer    Social History Social History   Tobacco Use  . Smoking status: Current Every Day Smoker    Packs/day: 1.00    Types: Cigarettes  . Smokeless tobacco: Never Used  . Tobacco comment: down to two cigarettes a day  Substance Use Topics  . Alcohol use: Yes  Alcohol/week: 12.6 oz    Types: 21 Cans of beer per week    Comment: 2-3 cans of beer daily  . Drug use: No     Allergies   Patient has no known allergies.   Review of Systems Review of Systems  Constitutional: Negative for fever.  Respiratory: Negative for shortness of breath.   Cardiovascular: Negative for chest pain.  Gastrointestinal: Positive for abdominal pain. Negative for diarrhea, nausea and vomiting.  Genitourinary: Negative for dysuria, hematuria and testicular pain.  Musculoskeletal: Positive for back pain (chronic, mild, unchanged).  All other systems reviewed and are negative.    Physical Exam Updated Vital Signs BP (!) 145/92 (BP Location: Right Arm)   Pulse 69   Temp 98.4 F (36.9 C) (Oral)   Resp 18   Ht 5\' 11"  (1.803 m)   Wt 73.5 kg (162 lb)   SpO2 99%   BMI  22.59 kg/m   Physical Exam  Constitutional: He is oriented to person, place, and time. He appears well-developed and well-nourished.  Non-toxic appearance. He does not appear ill. No distress.  Sitting in stretcher, resting comfortably  HENT:  Head: Normocephalic and atraumatic.  Right Ear: External ear normal.  Left Ear: External ear normal.  Nose: Nose normal.  Eyes: Right eye exhibits no discharge. Left eye exhibits no discharge.  Neck: Neck supple.  Cardiovascular: Normal rate, regular rhythm and normal heart sounds.  Pulmonary/Chest: Effort normal and breath sounds normal.  Abdominal: Soft. There is tenderness in the right lower quadrant. There is no CVA tenderness.  Musculoskeletal: He exhibits no edema.  Neurological: He is alert and oriented to person, place, and time.  Skin: Skin is warm and dry.  Nursing note and vitals reviewed.    ED Treatments / Results  Labs (all labs ordered are listed, but only abnormal results are displayed) Labs Reviewed  URINALYSIS, ROUTINE W REFLEX MICROSCOPIC - Abnormal; Notable for the following components:      Result Value   APPearance HAZY (*)    Hgb urine dipstick LARGE (*)    Bacteria, UA RARE (*)    All other components within normal limits  COMPREHENSIVE METABOLIC PANEL  CBC WITH DIFFERENTIAL/PLATELET    EKG None  Radiology Ct Abdomen Pelvis W Contrast  Result Date: 07/04/2018 CLINICAL DATA:  Right lower quadrant pain for 1 day. EXAM: CT ABDOMEN AND PELVIS WITH CONTRAST TECHNIQUE: Multidetector CT imaging of the abdomen and pelvis was performed using the standard protocol following bolus administration of intravenous contrast. CONTRAST:  100 ml ISOVUE-300 IOPAMIDOL (ISOVUE-300) INJECTION 61% COMPARISON:  CT abdomen and pelvis 01/23/2015. FINDINGS: Lower chest: Emphysematous change is seen in the lung bases. Lung bases are clear. No pleural or pericardial effusion. Hepatobiliary: The liver is mildly low attenuating throughout  consistent with fatty infiltration. No focal lesion. The gallbladder and biliary tree appear normal. Pancreas: Unremarkable. No pancreatic ductal dilatation or surrounding inflammatory changes. Spleen: Normal in size without focal abnormality. 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 Three stones are seen in the left kidney compared to 1 stone on the prior CT. Largest left renal stone is in the upper pole measuring 0.9 cm. There is no hydronephrosis on the right or left. No ureteral or urinary bladder stones are identified. The adrenal glands appear normal. Stomach/Bowel: Stomach is within normal limits. Appendix appears normal. No evidence of bowel wall thickening, distention, or inflammatory changes. Vascular/Lymphatic: Aortic  atherosclerosis. No enlarged abdominal or pelvic lymph nodes. Reproductive: Prostate is unremarkable. Other: No ascites or hernia. Musculoskeletal: No acute or focal bony abnormality. Degenerative disc disease in the lower lumbar spine is worst at L5-S1. IMPRESSION: Negative for appendicitis.  No acute abnormality. Multiple bilateral nonobstructing renal stones. Emphysema. Fatty infiltration of the liver. Atherosclerosis. Degenerative disc disease lower lumbar spine. Electronically Signed   By: Drusilla Kannerhomas  Dalessio M.D.   On: 07/04/2018 14:06    Procedures Procedures (including critical care time)  Medications Ordered in ED Medications  iopamidol (ISOVUE-300) 61 % injection 100 mL (100 mLs Intravenous Contrast Given 07/04/18 1329)     Initial Impression / Assessment and Plan / ED Course  I have reviewed the triage vital signs and the nursing notes.  Pertinent labs & imaging results that were available during my care of the patient were reviewed by me and considered in my medical decision making (see chart for details).     No obvious cause for the patient's right  lower quadrant abdominal pain.  His labs are reassuring.  He does have some blood in his urine but no obvious obstructing ureteral stone.  On reexamination after CT, the patient has no complaints of abdominal pain and states it feels like it is gone away.  Given this, he appears stable for discharge home and we discussed return precautions but I doubt missed appendicitis or other acute intra-abdominal emergency.  Return precautions.  Final Clinical Impressions(s) / ED Diagnoses   Final diagnoses:  Abdominal pain, RLQ (right lower quadrant)    ED Discharge Orders    None       Pricilla LovelessGoldston, Puneet Selden, MD 07/04/18 1434

## 2018-07-04 NOTE — Discharge Instructions (Signed)
If your abdominal pain worsens, recurs, or you develop any other new/concerning symptoms such as fever, vomiting, etc. then return to the ER for evaluation.  Otherwise you may take ibuprofen and/or Tylenol for your discomfort.

## 2018-09-13 ENCOUNTER — Encounter (HOSPITAL_COMMUNITY): Payer: Self-pay | Admitting: Emergency Medicine

## 2018-09-13 ENCOUNTER — Emergency Department (HOSPITAL_COMMUNITY): Payer: BLUE CROSS/BLUE SHIELD

## 2018-09-13 ENCOUNTER — Emergency Department (HOSPITAL_COMMUNITY)
Admission: EM | Admit: 2018-09-13 | Discharge: 2018-09-13 | Disposition: A | Discharge status: Home / Self Care | Payer: BLUE CROSS/BLUE SHIELD | Attending: Emergency Medicine | Admitting: Emergency Medicine

## 2018-09-13 ENCOUNTER — Other Ambulatory Visit: Payer: Self-pay

## 2018-09-13 DIAGNOSIS — J45901 Unspecified asthma with (acute) exacerbation: Secondary | ICD-10-CM | POA: Diagnosis not present

## 2018-09-13 DIAGNOSIS — R05 Cough: Secondary | ICD-10-CM | POA: Diagnosis present

## 2018-09-13 DIAGNOSIS — J40 Bronchitis, not specified as acute or chronic: Secondary | ICD-10-CM

## 2018-09-13 DIAGNOSIS — F17210 Nicotine dependence, cigarettes, uncomplicated: Secondary | ICD-10-CM | POA: Diagnosis not present

## 2018-09-13 DIAGNOSIS — Z79899 Other long term (current) drug therapy: Secondary | ICD-10-CM | POA: Diagnosis not present

## 2018-09-13 MED ORDER — ALBUTEROL SULFATE HFA 108 (90 BASE) MCG/ACT IN AERS
2.0000 | INHALATION_SPRAY | RESPIRATORY_TRACT | Status: DC | PRN
  Administered 2018-09-13: 2 via RESPIRATORY_TRACT
  Filled 2018-09-13: qty 6.7

## 2018-09-13 MED ORDER — BENZONATATE 100 MG PO CAPS: 100.0000 mg | ORAL_CAPSULE | Freq: Three times a day (TID) | ORAL | 0 refills | Status: DC | PRN

## 2018-09-13 MED ORDER — PREDNISONE 10 MG PO TABS: 20.0000 mg | ORAL_TABLET | Freq: Every day | ORAL | 0 refills | Status: DC

## 2018-09-13 MED ORDER — BENZONATATE 100 MG PO CAPS
100.0000 mg | ORAL_CAPSULE | Freq: Once | ORAL | Status: AC
  Administered 2018-09-13: 100 mg via ORAL
  Filled 2018-09-13: qty 1

## 2018-09-13 MED ORDER — PREDNISONE 20 MG PO TABS
ORAL_TABLET | ORAL | Status: AC
  Filled 2018-09-13: qty 3

## 2018-09-13 MED ORDER — PREDNISONE 50 MG PO TABS
60.0000 mg | ORAL_TABLET | Freq: Once | ORAL | Status: AC
  Administered 2018-09-13: 60 mg via ORAL

## 2018-09-13 NOTE — ED Provider Notes (Signed)
[facility] EMERGENCY DEPARTMENT Provider Note   CSN: [id number] Arrival date & time: 09/13/18  0755     History   Chief Complaint Chief Complaint  Patient presents with  . Cough    HPI Victor Tran is a 52 y.o. male.  The history is provided by the patient. No language interpreter was used.     52 year old male with history of asthma, tobacco abuse presenting complaining of cough.  Patient report for the past 2 to 3 days he has had persistent cough productive with white foamy sputum, wheezing, shortness of breath, chest tightness, generalized body aches and decrease in appetite.  Symptoms moderate in severity, nothing seems to make it better or worse.  Denies fever, chills, pleuritic chest pain, abdominal pain or rash.  He felt symptoms similar to prior bronchitis.  He did use his nebulizer at home with some improvement.  His wife is in the hospital for TIA work-up.  He did decide to quit smoking approximately 2 weeks ago.  He denies any prior history of PE or DVT, no recent surgery, prolonged bedrest, active cancer or hemoptysis.  Does report some mild throat discomfort but otherwise no runny nose sneezing or ear pain.  Past Medical History:  Diagnosis Date  . Allergy   . Asthma     Patient Active Problem List   Diagnosis Date Noted  . PNA (pneumonia) 12/21/2012  . Reactive airway disease with wheezing 12/21/2012    Past Surgical History:  Procedure Laterality Date  . EYE SURGERY          Home Medications    Prior to Admission medications   Medication Sig Start Date End Date Taking? Authorizing Provider  albuterol (PROVENTIL HFA;VENTOLIN HFA) 108 (90 Base) MCG/ACT inhaler Inhale 1-2 puffs into the lungs every 6 (six) hours as needed for wheezing or shortness of breath. 03/07/18   Idol, Raynelle Fanning, PA-C  albuterol (PROVENTIL) (2.5 MG/3ML) 0.083% nebulizer solution Take 3 mLs (2.5 mg total) by nebulization every 4 (four) hours as needed for wheezing or shortness of breath.  11/02/13   Carmelina Dane, MD  azithromycin (ZITHROMAX Z-PAK) 250 MG tablet Take 2 tablets by mouth on day one followed by one tablet daily for 4 days. 03/07/18   Burgess Amor, PA-C  cetirizine-pseudoephedrine (ZYRTEC-D) 5-120 MG tablet Take 1 tablet by mouth 2 (two) times daily. 03/07/18   Burgess Amor, PA-C  Fluticasone-Salmeterol (ADVAIR DISKUS) 250-50 MCG/DOSE AEPB Inhale 1 puff into the lungs 2 (two) times daily. 12/23/12   Ghimire, Werner Lean, MD  guaiFENesin (MUCINEX) 600 MG 12 hr tablet Take 2 tablets (1,200 mg total) by mouth daily as needed. For cough 12/23/12   Ghimire, Werner Lean, MD  ibuprofen (ADVIL,MOTRIN) 100 MG tablet Take 600 mg by mouth every 6 (six) hours as needed for fever.    [provider]  predniSONE (DELTASONE) 10 MG tablet Take 2 tablets (20 mg total) by mouth daily. 03/12/18   Bethann Berkshire, MD    Family History Family History  Problem Relation Age of Onset  . Diabetes Mother   . Hypertension Mother   . Heart disease Mother   . Stroke Father   . Heart disease Father   . HIV/AIDS Brother   . Cancer Brother 53       Colon Cancer    Social History Social History   Tobacco Use  . Smoking status: Current Every Day Smoker    Packs/day: 1.00    Types: Cigarettes  . Smokeless tobacco: Never  Used  . Tobacco comment: down to two cigarettes a day  Substance Use Topics  . Alcohol use: Yes    Alcohol/week: 21.0 standard drinks    Types: 21 Cans of beer per week    Comment: 2-3 cans of beer daily  . Drug use: No     Allergies   Patient has no known allergies.   Review of Systems Review of Systems  All other systems reviewed and are negative.    Physical Exam Updated Vital Signs BP 131/77 (BP Location: Right Arm)   Pulse (!) 109   Temp 98.3 F (36.8 C) (Oral)   Resp (!) 25   Ht 5\' 11"  (1.803 m)   Wt 70.3 kg   SpO2 98%   BMI 21.62 kg/m   Physical Exam  Constitutional: He is oriented to person, place, and time. He appears well-developed  and well-nourished. No distress.  HENT:  Head: Atraumatic.  Right Ear: External ear normal.  Left Ear: External ear normal.  Nose: Nose normal.  Mouth/Throat: Oropharynx is clear and moist.  Eyes: Conjunctivae are normal.  Neck: Neck supple. No JVD present. No tracheal deviation present.  Cardiovascular:  Tachycardia without murmur rubs or gallops  Pulmonary/Chest:  Mild scattered rhonchi without obvious wheezes, or rales.  Abdominal: Soft. There is no tenderness.  Musculoskeletal: He exhibits no edema.  Lymphadenopathy:    He has no cervical adenopathy.  Neurological: He is alert and oriented to person, place, and time.  Skin: No rash noted.  Psychiatric: He has a normal mood and affect.  Nursing note and vitals reviewed.    ED Treatments / Results  Labs (all labs ordered are listed, but only abnormal results are displayed) Labs Reviewed - No data to display  EKG None  ED ECG REPORT   Date: 09/13/2018  Rate: 104  Rhythm: sinus tachycardia  QRS Axis: normal  Intervals: normal  ST/T Wave abnormalities: nonspecific ST changes  Conduction Disutrbances:none  Narrative Interpretation: Biatrial enlargement  Old EKG Reviewed: unchanged  I have personally reviewed the EKG tracing and agree with the computerized printout as noted.   Radiology Dg Chest 2 View  Result Date: 09/13/2018 CLINICAL DATA:  Cough and shortness of breath EXAM: CHEST - 2 VIEW COMPARISON:  March 12, 2018 FINDINGS: There is no edema or consolidation. Heart size and pulmonary vascularity are normal. No adenopathy. There is upper thoracic levoscoliosis. IMPRESSION: No edema or consolidation. Electronically Signed   By: Bretta Bang III M.D.   On: 09/13/2018 08:55    Procedures Procedures (including critical care time)  Medications Ordered in ED Medications  predniSONE (DELTASONE) 20 MG tablet (has no administration in time range)  albuterol (PROVENTIL HFA;VENTOLIN HFA) 108 (90 Base) MCG/ACT  inhaler 2 puff (has no administration in time range)  benzonatate (TESSALON) capsule 100 mg (100 mg Oral Given 09/13/18 0919)  predniSONE (DELTASONE) tablet 60 mg (60 mg Oral Given 09/13/18 0920)     Initial Impression / Assessment and Plan / ED Course  I have reviewed the triage vital signs and the nursing notes.  Pertinent labs & imaging results that were available during my care of the patient were reviewed by me and considered in my medical decision making (see chart for details).     BP 131/77 (BP Location: Right Arm)   Pulse (!) 109   Temp 98.3 F (36.8 C) (Oral)   Resp (!) 25   Ht 5\' 11"  (1.803 m)   Wt 70.3 kg   SpO2 98%  BMI 21.62 kg/m    Final Clinical Impressions(s) / ED Diagnoses   Final diagnoses:  Bronchitis    ED Discharge Orders         Ordered    predniSONE (DELTASONE) 10 MG tablet  Daily     09/13/18 0932    benzonatate (TESSALON) 100 MG capsule  3 times daily PRN     09/13/18 0932         8:46 AM Patient presents with symptoms suggestive of bronchitis.  Will obtain chest x-ray to rule out focal infiltrate.  Low suspicion for PE causing his symptoms.  Cough medication given.  9:33 AM Chest x-ray without edema or consolidation.  Patient will be treated for bronchitis with steroid taper course, cough medication, and albuterol inhaler to use as needed.  Return precautions discussed.   Fayrene Helper, PA-C 09/13/18 1610    Donnetta Hutching, MD 09/14/18 [phone]

## 2018-09-13 NOTE — ED Notes (Signed)
Pt ambulatory to waiting room. Pt verbalized understanding of discharge instructions.   

## 2018-09-13 NOTE — ED Triage Notes (Signed)
Cough and sob x 3 days

## 2018-09-13 NOTE — ED Notes (Signed)
Patient transported to X-ray 

## 2018-09-13 NOTE — ED Notes (Signed)
Pt returned from X-ray.  

## 2018-09-23 ENCOUNTER — Ambulatory Visit (INDEPENDENT_AMBULATORY_CARE_PROVIDER_SITE_OTHER): Payer: BLUE CROSS/BLUE SHIELD | Admitting: Family Medicine

## 2018-09-23 ENCOUNTER — Other Ambulatory Visit: Payer: Self-pay

## 2018-09-23 ENCOUNTER — Encounter: Payer: Self-pay | Admitting: Family Medicine

## 2018-09-23 VITALS — BP 118/80 | HR 90 | Temp 98.7°F | Ht 71.0 in | Wt 147.0 lb

## 2018-09-23 DIAGNOSIS — Z114 Encounter for screening for human immunodeficiency virus [HIV]: Secondary | ICD-10-CM

## 2018-09-23 DIAGNOSIS — Z13220 Encounter for screening for lipoid disorders: Secondary | ICD-10-CM | POA: Diagnosis not present

## 2018-09-23 DIAGNOSIS — Z23 Encounter for immunization: Secondary | ICD-10-CM | POA: Diagnosis not present

## 2018-09-23 DIAGNOSIS — J441 Chronic obstructive pulmonary disease with (acute) exacerbation: Secondary | ICD-10-CM | POA: Diagnosis not present

## 2018-09-23 DIAGNOSIS — Z720 Tobacco use: Secondary | ICD-10-CM | POA: Insufficient documentation

## 2018-09-23 DIAGNOSIS — Z0000 Encounter for general adult medical examination without abnormal findings: Secondary | ICD-10-CM | POA: Insufficient documentation

## 2018-09-23 DIAGNOSIS — J209 Acute bronchitis, unspecified: Secondary | ICD-10-CM | POA: Insufficient documentation

## 2018-09-23 MED ORDER — AZITHROMYCIN 250 MG PO TABS: ORAL_TABLET | ORAL | 0 refills | Status: DC

## 2018-09-23 MED ORDER — ALBUTEROL SULFATE (2.5 MG/3ML) 0.083% IN NEBU: 2.5000 mg | INHALATION_SOLUTION | RESPIRATORY_TRACT | 5 refills | Status: DC | PRN

## 2018-09-23 MED ORDER — TIOTROPIUM BROMIDE MONOHYDRATE 18 MCG IN CAPS: 18.0000 ug | ORAL_CAPSULE | Freq: Every day | RESPIRATORY_TRACT | 12 refills | Status: DC

## 2018-09-23 NOTE — Patient Instructions (Addendum)
It was wonderful to see you today.  Thank you for choosing [facility].   Please call [phone] with any questions about today's appointment.  Please be sure to schedule follow up at the front  desk before you leave today.   Terisa Starr, MD  Family Medicine   Please schedule with Dr. Raymondo Band for a lung function check  I will see you in 1 month   For your cough--- try honey with tea  Humidifer in your room    Claritin for your allergies

## 2018-09-23 NOTE — Progress Notes (Signed)
  Patient Name: Rolfe Hartsell Date of Birth: [birth year] Date of Visit: 09/23/18 PCP: Patient, No Pcp Per  Chief Complaint: persistent cough, establish care   Subjective: Spiro Ausborn is a pleasant 52 y.o. year old woman with a history of tobacco abuse and suspected obstructive lung disease seen today for persistent cough.  The patient was seen on September 26 in the emergency room for similar symptoms he is alone today.  He reports his wife is a patient at this practice.   The patient reports a 2-week history of wheezing and dyspnea on exertion.  He reports his cough is the worst part.  His cough is productive with clear and yellow sputum.  He denies fevers, chills, chest pain, orthopnea, paroxysmal nocturnal dyspnea, lower extremity edema.  He works on his feet 12 hours a day and his work requires a lot of walking and lifting.  He has been unable to do his work.  The patient is under considerable stress.  His wife recently had a transient ischemic attack.  She is currently readmitted to the hospital.   Past medical history: Significant for tobacco abuse and recurrent bronchitis.  This is his third episode this year.  He also has a history of seasonal allergies  Surgical history: Significant for esotropia  surgery on his right eye as a child  ROS:  ROS Negative except for as in HPI.  I have reviewed the patient's medical, surgical, family, and social history as appropriate.   Vitals:   09/23/18 1042  BP: 118/80  Pulse: 90  Temp: 98.7 F (37.1 C)  SpO2: 98%   Filed Weights   09/23/18 1042  Weight: 147 lb (66.7 kg)   HEENT: Sclera anicteric. Dentition is moderate. Appears well hydrated. Neck: Supple Cardiac: Regular rate and rhythm. Normal S1/S2. No murmurs, rubs, or gallops appreciated. Lungs: Bilateral wheezing on expiration  Abdomen: Normoactive bowel sounds. No tenderness to deep or light palpation. No rebound or guarding.  Extremities: Warm, well perfused without edema.    Skin: Warm, dry Psych: Pleasant and appropriate   Doil was seen today for cough.  Diagnoses and all orders for this visit:  Screening for HIV (human immunodeficiency virus) -     HIV antibody (with reflex)  Screening for hyperlipidemia -     Lipid Panel  COPD exacerbation (HCC) personally reviewed recent imaging and EKG.  Suspect this is continuation of by his bronchitis.  He does have 2 out of 3 and anthonisen criteria for exacerbation, will add antibiotic. He was already treated with steroids. Discussed cough may last 3-4 weeks. Recommended following up for spirometry with Dr. Raymondo Band.  -     tiotropium (SPIRIVA HANDIHALER) 18 MCG inhalation capsule; Place 1 capsule (18 mcg total) into inhaler and inhale daily. -     azithromycin (ZITHROMAX Z-PAK) 250 MG tablet; Take 2 tablets on first day then 1 tablet each day thereafter -     albuterol (PROVENTIL) (2.5 MG/3ML) 0.083% nebulizer solution; Take 3 mLs (2.5 mg total) by nebulization every 4 (four) hours as needed for wheezing or shortness of breath.  Need for immunization against influenza -     Flu Vaccine QUAD 36+ mos IM    Terisa Starr, MD  [facility] Medicine Teaching Service

## 2018-09-23 NOTE — Assessment & Plan Note (Signed)
Benefits of quitting.  Patient is pre-contemplative at this time.

## 2018-09-23 NOTE — Assessment & Plan Note (Signed)
Given his long-standing smoking history and recurrent bouts of bronchitis recommended pulmonary function testing with Dr. Raymondo Band when his acute condition has improved.  Started Spiriva.

## 2018-09-24 ENCOUNTER — Encounter: Payer: Self-pay | Admitting: Family Medicine

## 2018-09-24 LAB — LIPID PANEL
CHOLESTEROL TOTAL: 160 mg/dL (ref 100–199)
Chol/HDL Ratio: 2.5 ratio (ref 0.0–5.0)
HDL: 64 mg/dL (ref >39)
LDL Calculated: 71 mg/dL (ref 0–99)
TRIGLYCERIDES: 123 mg/dL (ref 0–149)
VLDL Cholesterol Cal: 25 mg/dL (ref 5–40)

## 2018-09-24 LAB — HIV ANTIBODY (ROUTINE TESTING W REFLEX): HIV SCREEN 4TH GENERATION: NONREACTIVE

## 2020-01-25 IMAGING — DX DG CHEST 2V
2 series · 2 of 2 positions shown · non-contrast
Comparison: March 12, 2018

CLINICAL DATA: Cough and shortness of breath

EXAM:
CHEST - 2 VIEW

[chest pa]
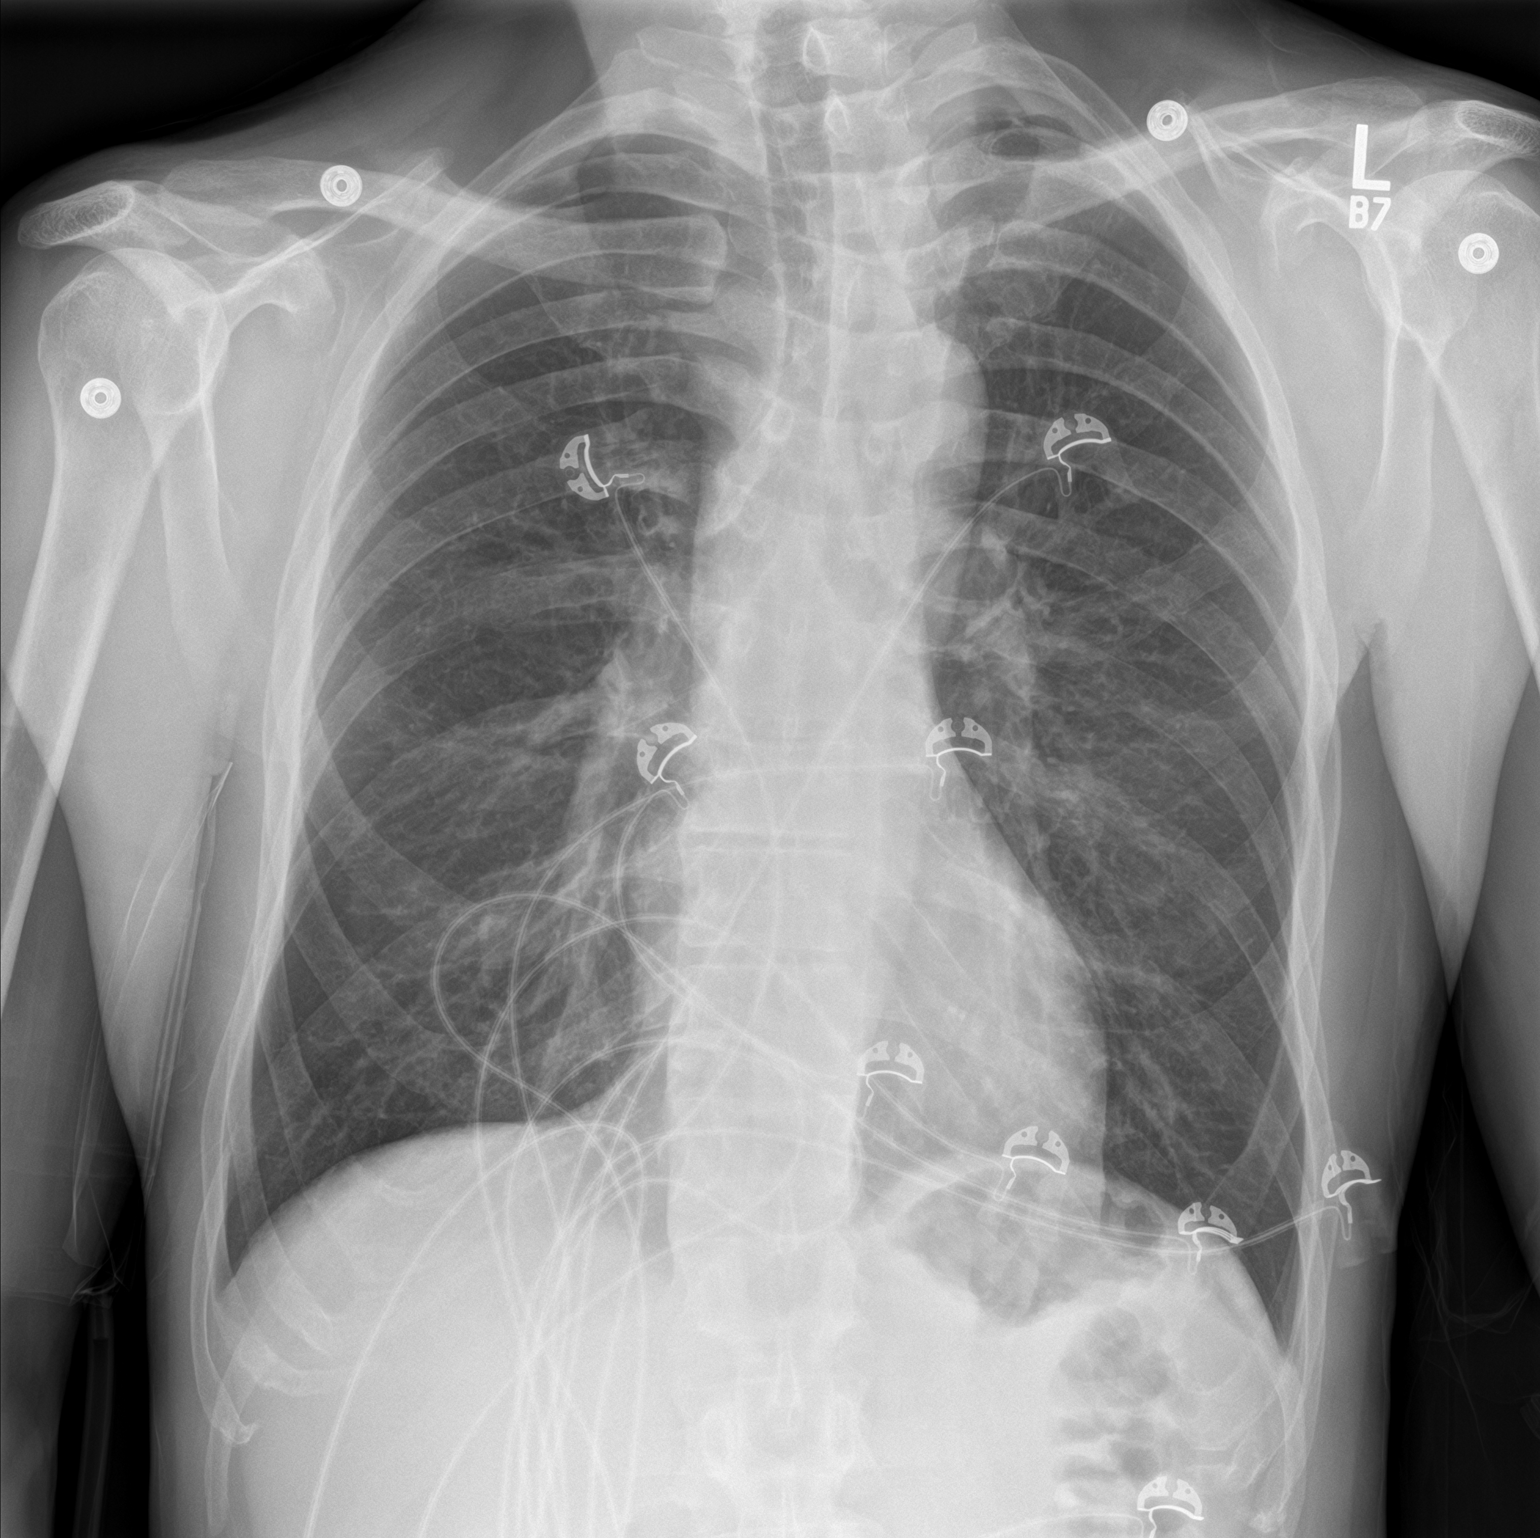

[chest lat]
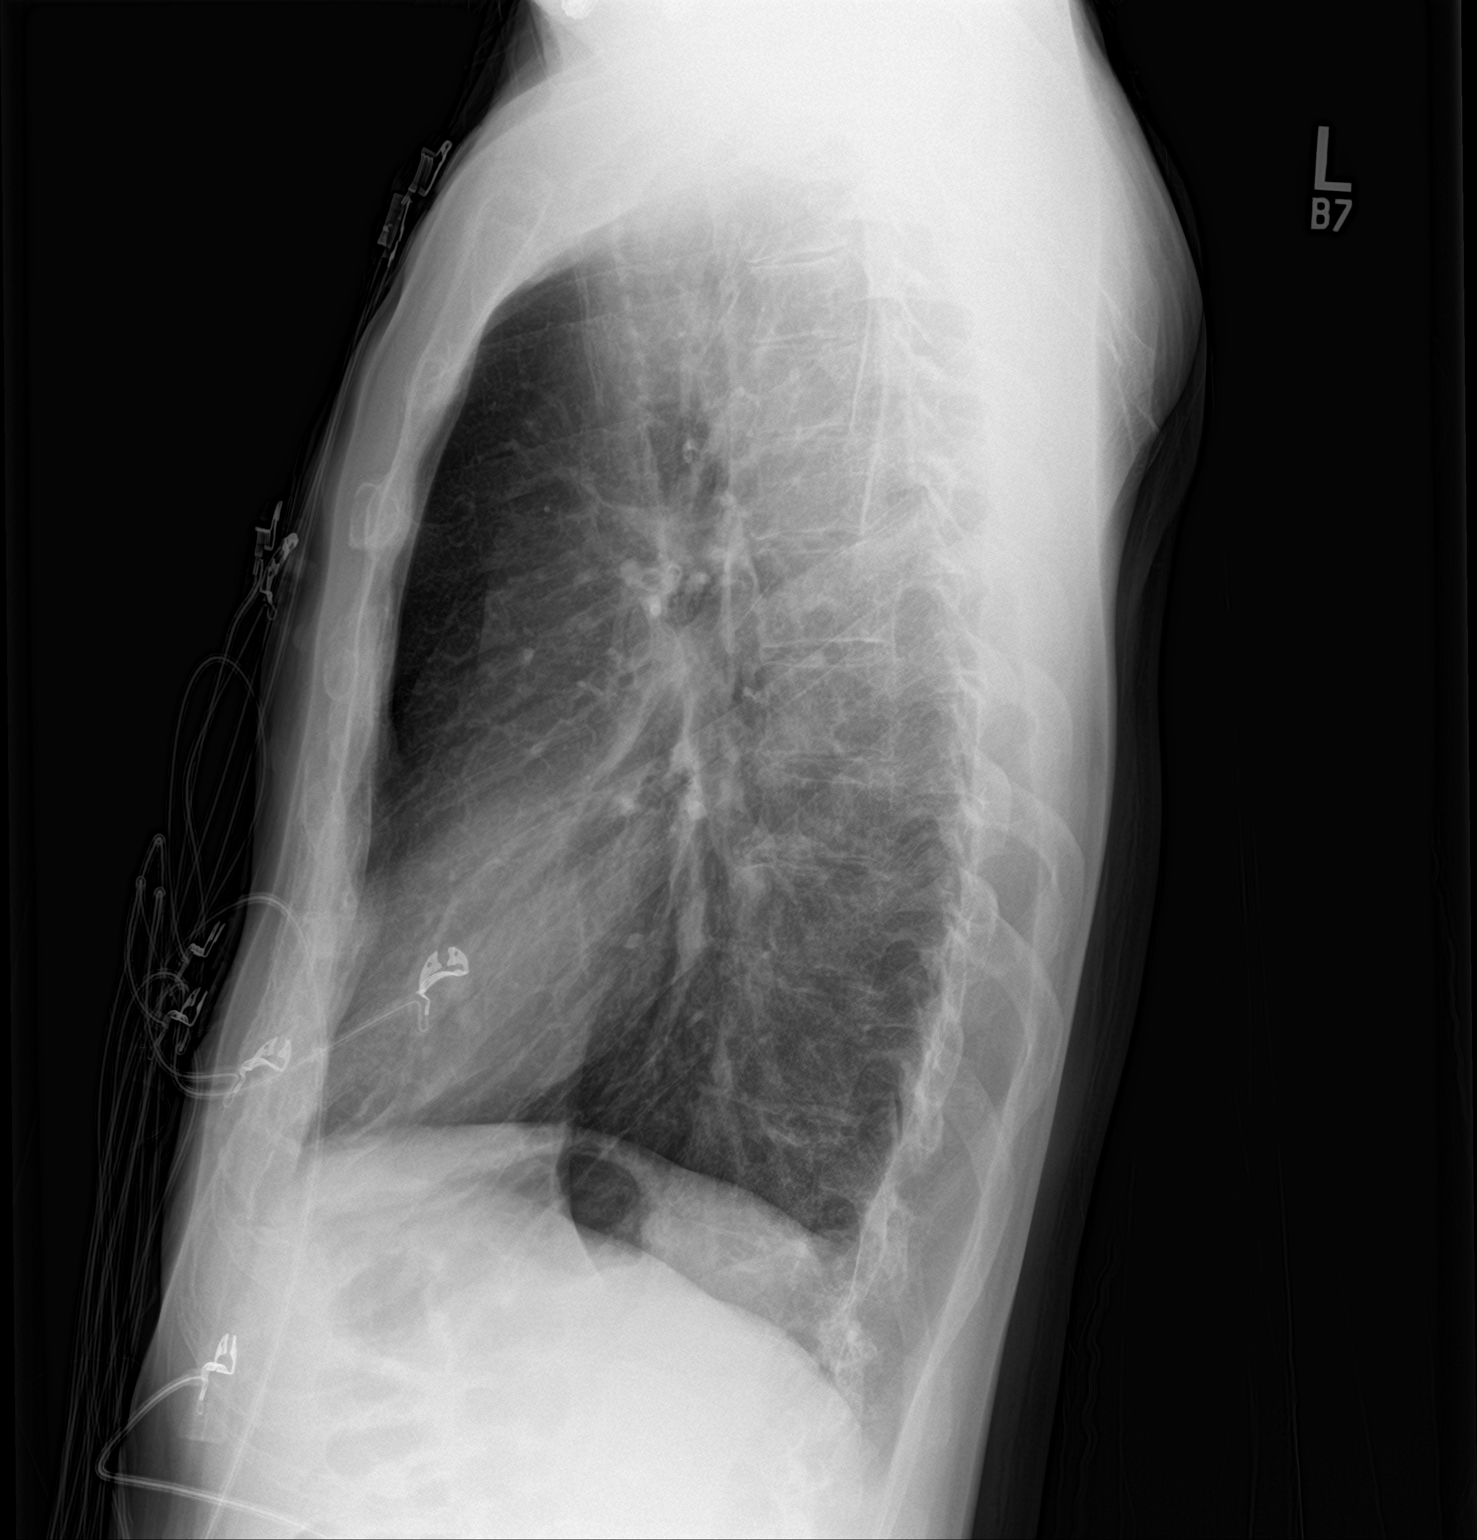

[2 of 2 positions shown; findings below may reference images not displayed]

FINDINGS: There is no edema or consolidation. Heart size and pulmonary
vascularity are normal. No adenopathy. There is upper thoracic
levoscoliosis.
IMPRESSION: No edema or consolidation.

## 2021-03-07 ENCOUNTER — Emergency Department (HOSPITAL_COMMUNITY)
Admission: EM | Admit: 2021-03-07 | Discharge: 2021-03-07 | Disposition: A | Discharge status: Home / Self Care | Payer: 59 | Attending: Emergency Medicine | Admitting: Emergency Medicine

## 2021-03-07 ENCOUNTER — Encounter (HOSPITAL_COMMUNITY): Payer: Self-pay | Admitting: Emergency Medicine

## 2021-03-07 ENCOUNTER — Emergency Department (HOSPITAL_COMMUNITY): Payer: 59

## 2021-03-07 ENCOUNTER — Other Ambulatory Visit: Payer: Self-pay

## 2021-03-07 DIAGNOSIS — F17210 Nicotine dependence, cigarettes, uncomplicated: Secondary | ICD-10-CM | POA: Diagnosis not present

## 2021-03-07 DIAGNOSIS — J45909 Unspecified asthma, uncomplicated: Secondary | ICD-10-CM | POA: Insufficient documentation

## 2021-03-07 DIAGNOSIS — R059 Cough, unspecified: Secondary | ICD-10-CM | POA: Insufficient documentation

## 2021-03-07 DIAGNOSIS — R0602 Shortness of breath: Secondary | ICD-10-CM | POA: Insufficient documentation

## 2021-03-07 MED ORDER — ALBUTEROL SULFATE HFA 108 (90 BASE) MCG/ACT IN AERS
2.0000 | INHALATION_SPRAY | RESPIRATORY_TRACT | Status: DC | PRN
  Administered 2021-03-07: 2 via RESPIRATORY_TRACT
  Filled 2021-03-07: qty 6.7

## 2021-03-07 MED ORDER — AZITHROMYCIN 250 MG PO TABS
500.0000 mg | ORAL_TABLET | Freq: Once | ORAL | Status: AC
  Administered 2021-03-07: 500 mg via ORAL
  Filled 2021-03-07: qty 2

## 2021-03-07 MED ORDER — PREDNISONE 50 MG PO TABS
60.0000 mg | ORAL_TABLET | Freq: Once | ORAL | Status: AC
  Administered 2021-03-07: 60 mg via ORAL
  Filled 2021-03-07: qty 1

## 2021-03-07 MED ORDER — PREDNISONE 20 MG PO TABS: ORAL_TABLET | ORAL | 0 refills | Status: DC

## 2021-03-07 MED ORDER — AZITHROMYCIN 250 MG PO TABS: 250.0000 mg | ORAL_TABLET | Freq: Every day | ORAL | 0 refills | Status: DC

## 2021-03-07 MED ORDER — GUAIFENESIN ER 600 MG PO TB12: 1200.0000 mg | ORAL_TABLET | Freq: Every day | ORAL | 0 refills | Status: DC | PRN

## 2021-03-07 NOTE — ED Provider Notes (Signed)
[facility] EMERGENCY DEPARTMENT Provider Note   CSN: [id number] Arrival date & time: 03/07/21  5597     History Chief Complaint  Patient presents with  . Cough  . Shortness of Breath    Victor Tran is a 55 y.o. male.  History of bronchitis and here with symptoms similar to the same.  Patient states that he had a couple days of a green adductive cough but then today had worsening shortness of breath and worsening cough on the way to work so presents here for further evaluation.  Patient states that he quit smoking a couple weeks ago.  Had overall been doing okay.  States he had bronchitis in the past and had similar coughing episodes and shortness of breath.  Patient states that he has not tried any for the symptoms.  No sick contacts.  Tested for Covid recently and is negative with no other sick contacts since that time.   Cough Associated symptoms: shortness of breath   Shortness of Breath Associated symptoms: cough        Past Medical History:  Diagnosis Date  . Allergy   . Asthma   . Nephrolithiasis   . Tobacco abuse     Patient Active Problem List   Diagnosis Date Noted  . Tobacco abuse 09/23/2018  . Acute bronchitis 09/23/2018  . Healthcare maintenance 09/23/2018    Past Surgical History:  Procedure Laterality Date  . EYE SURGERY         Family History  Problem Relation Age of Onset  . Diabetes Mother   . Hypertension Mother   . Heart disease Mother   . Stroke Father   . Heart disease Father   . Diabetes Father   . HIV/AIDS Brother   . Cancer Brother 51       Colon Cancer    Social History   Tobacco Use  . Smoking status: Current Every Day Smoker    Packs/day: 1.00    Types: Cigarettes  . Smokeless tobacco: Never Used  . Tobacco comment: down to two cigarettes a day  Vaping Use  . Vaping Use: Never used  Substance Use Topics  . Alcohol use: Yes    Alcohol/week: 21.0 standard drinks    Types: 21 Cans of beer per week    Comment: 2-3  cans of beer daily  . Drug use: No    Home Medications Prior to Admission medications   Medication Sig Start Date End Date Taking? Authorizing Provider  albuterol (PROVENTIL HFA;VENTOLIN HFA) 108 (90 Base) MCG/ACT inhaler Inhale 1-2 puffs into the lungs every 6 (six) hours as needed for wheezing or shortness of breath. 03/07/18   Idol, Raynelle Fanning, PA-C  albuterol (PROVENTIL) (2.5 MG/3ML) 0.083% nebulizer solution Take 3 mLs (2.5 mg total) by nebulization every 4 (four) hours as needed for wheezing or shortness of breath. 09/23/18   Westley Chandler, MD  azithromycin (ZITHROMAX Z-PAK) 250 MG tablet Take 2 tablets on first day then 1 tablet each day thereafter 09/23/18   Westley Chandler, MD  guaiFENesin (MUCINEX) 600 MG 12 hr tablet Take 2 tablets (1,200 mg total) by mouth daily as needed. For cough 12/23/12   Ghimire, Werner Lean, MD  ibuprofen (ADVIL,MOTRIN) 100 MG tablet Take 600 mg by mouth every 6 (six) hours as needed for fever.    [provider]  tiotropium (SPIRIVA HANDIHALER) 18 MCG inhalation capsule Place 1 capsule (18 mcg total) into inhaler and inhale daily. 09/23/18   Westley Chandler, MD  Allergies    Patient has no known allergies.  Review of Systems   Review of Systems  Respiratory: Positive for cough and shortness of breath.   All other systems reviewed and are negative.   Physical Exam Updated Vital Signs BP (!) 147/88 (BP Location: Right Arm)   Pulse (!) 106   Temp 98.6 F (37 C) (Oral)   Resp (!) 22   Ht 5\' 11"  (1.803 m)   Wt 69.9 kg   SpO2 98%   BMI 21.48 kg/m   Physical Exam Vitals and nursing note reviewed.  Constitutional:      Appearance: He is well-developed.  HENT:     Head: Normocephalic and atraumatic.  Eyes:     Pupils: Pupils are equal, round, and reactive to light.  Cardiovascular:     Rate and Rhythm: Normal rate.  Pulmonary:     Effort: Pulmonary effort is normal. No respiratory distress.     Breath sounds: Decreased breath sounds and  wheezing present.  Abdominal:     General: There is no distension.     Palpations: Abdomen is soft. There is no hepatomegaly, splenomegaly or mass.     Tenderness: There is no abdominal tenderness.  Musculoskeletal:        General: Normal range of motion.     Cervical back: Normal range of motion.  Skin:    General: Skin is warm and dry.  Neurological:     General: No focal deficit present.     Mental Status: He is alert.     ED Results / Procedures / Treatments   Labs (all labs ordered are listed, but only abnormal results are displayed) Labs Reviewed - No data to display  EKG None  Radiology No results found.  Procedures Procedures will  Medications Ordered in ED Medications  albuterol (VENTOLIN HFA) 108 (90 Base) MCG/ACT inhaler 2 puff (has no administration in time range)  predniSONE (DELTASONE) tablet 60 mg (has no administration in time range)    ED Course  I have reviewed the triage vital signs and the nursing notes.  Pertinent labs & imaging results that were available during my care of the patient were reviewed by me and considered in my medical decision making (see chart for details).    MDM Rules/Calculators/A&P                          Not hypoxic.  Not any respiratory distress.  Barely tachypneic.  Is wheezing with some diminished breath sounds.  I will treat for bronchitis.  Get x-ray to ensure no consolidative pneumonia otherwise appears to be stable for discharge.  Final Clinical Impression(s) / ED Diagnoses Final diagnoses:  None    Rx / DC Orders ED Discharge Orders    None       Mesner, , MD 03/16/21 [phone]

## 2021-03-07 NOTE — ED Triage Notes (Signed)
Pt with c/o cough and SOB since Saturday.

## 2021-08-09 ENCOUNTER — Ambulatory Visit
Admission: EM | Admit: 2021-08-09 | Discharge: 2021-08-09 | Disposition: A | Discharge status: Home / Self Care | Payer: 59 | Attending: Emergency Medicine | Admitting: Emergency Medicine

## 2021-08-09 ENCOUNTER — Other Ambulatory Visit: Payer: Self-pay

## 2021-08-09 ENCOUNTER — Encounter: Payer: Self-pay | Admitting: Emergency Medicine

## 2021-08-09 DIAGNOSIS — R55 Syncope and collapse: Secondary | ICD-10-CM

## 2021-08-09 NOTE — ED Triage Notes (Signed)
Pt felt dizzy today at work, bp check there was 140/100. Pt has not complaints at this time. Was told by work to come and get checked out.

## 2021-08-09 NOTE — Discharge Instructions (Addendum)
I have placed a referral to cardiology, but call them and schedule appointment to be seen as soon as you can.    Below is a list of primary care practices who are taking new patients for you to follow-up with.  [facility] internal medicine clinic Ground Floor - [facility], [address] [place], [place], [place] [address] [phone]  [facility] at [facility] [address] [place], [place] [address] [phone]  [facility] [address] [place], [place] [address] [phone]  [facility] [phone] [address] [place] [address]  [facility]: [address] [place] [address]  [phone]  [facility]: [address] [place] [address]  [phone]  [facility] : [address] [place] [address] [phone]  [facility]: [address] [place] [address] [phone]  [facility]: [address] [place] [address] [phone]  Dr. Oneal Grout [address] [facility] [place] [address]  [phone]  Go to www.goodrx.com  or www.costplusdrugs.com to look up your medications. This will give you a list of where you can find your prescriptions at the most affordable prices. Or ask the pharmacist what the cash price is, or if they have any other discount programs available to help make your medication more affordable. This can be less expensive than what you would pay with insurance.

## 2021-08-09 NOTE — ED Provider Notes (Signed)
HPI  SUBJECTIVE:  Victor Tran is a 55 y.o. male who presents with an episode of near syncope today.  States that he was standing up at work, and "almost passed out".  States that he was moving around, denies standing with his knees locked for prolonged period of time.  States that he felt lightheaded, dizzy, flushed, with "fuzzy vision" and palpitations.  He sat down, but continued to feel this way for about an hour.  No nausea, diaphoresis, tunnel vision, tinnitus, vertigo, chest pain, shortness of breath, loss of consciousness, headache, slurred speech, arm or leg weakness, facial droop, abdominal or back pain.  No syncope.  Measured his blood pressure at work and it was 140/100.  He ate breakfast this morning.  He has had episodes like this over the past 6 months that happen at random times.  He has not yet sought medical care for this.  He states that he is feeling "okay" now, but "not 100%".  He denies syncope with exertion.  He tried resting with improvement of symptoms.  No aggravating factors.  He has a history of asthma and continues to smoke.  No history of MI, coronary disease, diabetes, hypertension, arrhythmia, thyroid disease, aortic stenosis, mitral valve prolapse, CVA.  Family history significant for father with MI at age 62-60.  No history of arrhythmia, syncope.  PMD: None  Patient denies surgery in the past 4 weeks, recent immobilization, hemoptysis.  No history of PE, DVT, cancer.  Past Medical History:  Diagnosis Date   Allergy    Asthma    Nephrolithiasis    Tobacco abuse     Past Surgical History:  Procedure Laterality Date   EYE SURGERY      Family History  Problem Relation Age of Onset   Diabetes Mother    Hypertension Mother    Heart disease Mother    Stroke Father    Heart disease Father    Diabetes Father    HIV/AIDS Brother    Cancer Brother 80       Colon Cancer    Social History   Tobacco Use   Smoking status: Every Day    Packs/day: 1.00     Types: Cigarettes   Smokeless tobacco: Never   Tobacco comments:    down to two cigarettes a day  Vaping Use   Vaping Use: Never used  Substance Use Topics   Alcohol use: Yes    Alcohol/week: 21.0 standard drinks    Types: 21 Cans of beer per week    Comment: 2-3 cans of beer daily   Drug use: No    No current facility-administered medications for this encounter.  Current Outpatient Medications:    albuterol (PROVENTIL HFA;VENTOLIN HFA) 108 (90 Base) MCG/ACT inhaler, Inhale 1-2 puffs into the lungs every 6 (six) hours as needed for wheezing or shortness of breath., Disp: 1 Inhaler, Rfl: 0   albuterol (PROVENTIL) (2.5 MG/3ML) 0.083% nebulizer solution, Take 3 mLs (2.5 mg total) by nebulization every 4 (four) hours as needed for wheezing or shortness of breath., Disp: 150 vial, Rfl: 5   tiotropium (SPIRIVA HANDIHALER) 18 MCG inhalation capsule, Place 1 capsule (18 mcg total) into inhaler and inhale daily., Disp: 30 capsule, Rfl: 12  No Known Allergies   ROS  As noted in HPI.   Physical Exam  BP (!) 147/87 (BP Location: Right Arm)   Pulse 80   Temp 98.4 F (36.9 C) (Oral)   Resp 18   SpO2 98%  Constitutional: Well developed, well nourished, no acute distress Eyes:  EOMI, conjunctiva normal bilaterally HENT: Normocephalic, atraumatic,mucus membranes moist Respiratory: Normal inspiratory effort, lungs clear bilaterally Cardiovascular: Normal rate regular rhythm, no murmurs, rubs, gallops GI: nondistended soft, nontender, no guarding, rebound.  Active bowel sounds.  No palpable pulsatile abdominal mass. skin: No rash, skin intact Musculoskeletal: Calves nontender, no edema Neurologic: Alert & oriented x 3, no focal neuro deficits.  Speech fluent. Psychiatric: Speech and behavior appropriate   ED Course   Medications - No data to display  Orders Placed This Encounter  Procedures   Ambulatory referral to Cardiology    Referral Priority:   Routine    Referral Type:    Consultation    Referral Reason:   Specialty Services Required    Requested Specialty:   Cardiology    Number of Visits Requested:   1   ED EKG    Standing Status:   Standing    Number of Occurrences:   1    Order Specific Question:   Reason for Exam    Answer:   Weakness   EKG 12-Lead    Standing Status:   Standing    Number of Occurrences:   1    No results found for this or any previous visit (from the past 24 hour(s)). No results found.  ED Clinical Impression  1. Near syncope      ED Assessment/Plan  Patient is currently asymptomatic.  This has been going on for over 6 months.  Will check EKG.  EKG: Normal sinus rhythm, rate 62.  Normal axis, normal intervals.  No hypertrophy.  Isolated T wave inversion in aVL, present in previous EKG from 08/2018.  No ST elevation.  EKG reassuring, however patient was asymptomatic while EKG was obtained.  The differential is vast.  However there does not appear to be stroke, MI/ACS.  Well's score is 0.  Doubt PE.  Because this is been going on for some time, feel that it is safe to pursue outpatient work-up.  Will order referral to cardiology, and provide primary care list for ongoing care.  Will place order and finding assistance in finding a PMD.  He is to go to the emergency department if this becomes more frequent or if he has an episode of syncope.  Discussed  MDM, treatment plan, and plan for follow-up with patient. Discussed sn/sx that should prompt return to the ED. patient agrees with plan.   No orders of the defined types were placed in this encounter.     *This clinic note was created using Dragon dictation software. Therefore, there may be occasional mistakes despite careful proofreading.  ?    Domenick Gong, MD 08/10/21 [phone]

## 2021-09-08 ENCOUNTER — Ambulatory Visit
Admission: EM | Admit: 2021-09-08 | Discharge: 2021-09-08 | Disposition: A | Discharge status: Home / Self Care | Payer: Self-pay | Attending: Emergency Medicine | Admitting: Emergency Medicine

## 2021-09-08 ENCOUNTER — Other Ambulatory Visit: Payer: Self-pay

## 2021-09-08 DIAGNOSIS — R059 Cough, unspecified: Secondary | ICD-10-CM

## 2021-09-08 DIAGNOSIS — J209 Acute bronchitis, unspecified: Secondary | ICD-10-CM

## 2021-09-08 MED ORDER — PREDNISONE 10 MG (21) PO TBPK: ORAL_TABLET | Freq: Every day | ORAL | 0 refills | Status: DC

## 2021-09-08 MED ORDER — DEXAMETHASONE SODIUM PHOSPHATE 10 MG/ML IJ SOLN
10.0000 mg | Freq: Once | INTRAMUSCULAR | Status: AC
  Administered 2021-09-08: 10 mg via INTRAMUSCULAR

## 2021-09-08 MED ORDER — AZITHROMYCIN 250 MG PO TABS: 250.0000 mg | ORAL_TABLET | Freq: Every day | ORAL | 0 refills | Status: DC

## 2021-09-08 MED ORDER — ALBUTEROL SULFATE (2.5 MG/3ML) 0.083% IN NEBU: 2.5000 mg | INHALATION_SOLUTION | Freq: Four times a day (QID) | RESPIRATORY_TRACT | 12 refills | Status: DC | PRN

## 2021-09-08 NOTE — ED Provider Notes (Signed)
[facility]   [id number] 09/08/21 Arrival Time: 0957  Cc: COUGH  SUBJECTIVE:  Victor Tran is a 55 y.o. male who presents with SOB, foamy/ clear productive cough, wheezing x 5 days.  Denies positive sick exposure or precipitating event.  Hx significant for smoking, quit 6 months ago.  Denies hx of COPD.  Has tried nebulizer with some relief.  Needs refill.  Symptoms are made worse with deep breath.  Reports previous symptoms in the past with bronchitis.   Denies fever, chills, sinus pain, rhinorrhea, sore throat, chest pain, nausea, changes in bowel or bladder habits.    ROS: As per HPI.  All other pertinent ROS negative.     Past Medical History:  Diagnosis Date   Allergy    Asthma    Nephrolithiasis    Tobacco abuse    Past Surgical History:  Procedure Laterality Date   EYE SURGERY     No Known Allergies No current facility-administered medications on file prior to encounter.   Current Outpatient Medications on File Prior to Encounter  Medication Sig Dispense Refill   tiotropium (SPIRIVA HANDIHALER) 18 MCG inhalation capsule Place 1 capsule (18 mcg total) into inhaler and inhale daily. 30 capsule 12    Social History   Socioeconomic History   Marital status: Married    Spouse name: n/a   Number of children: 0   Years of education: 12   Highest education level: Not on file  Occupational History   Occupation: Insurance claims handler  Tobacco Use   Smoking status: Every Day    Packs/day: 1.00    Types: Cigarettes   Smokeless tobacco: Never   Tobacco comments:    down to two cigarettes a day  Vaping Use   Vaping Use: Never used  Substance and Sexual Activity   Alcohol use: Yes    Alcohol/week: 21.0 standard drinks    Types: 21 Cans of beer per week    Comment: 2-3 cans of beer daily   Drug use: No   Sexual activity: Yes    Birth control/protection: Condom    Comment: married   Other Topics Concern   Not on file  Social History Narrative   Works full time at  [employer]   Married- first wife died tragically (he found her in bed, passed away)    Had 2 dogs 3 cats   Loves to fish    Social Determinants of Health   Financial Resource Strain: Not on file  Food Insecurity: Not on file  Transportation Needs: Not on file  Physical Activity: Not on file  Stress: Not on file  Social Connections: Not on file  Intimate Partner Violence: Not on file   Family History  Problem Relation Age of Onset   Diabetes Mother    Hypertension Mother    Heart disease Mother    Stroke Father    Heart disease Father    Diabetes Father    HIV/AIDS Brother    Cancer Brother 90       Colon Cancer     OBJECTIVE:  Vitals:   09/08/21 1153  BP: 128/77  Pulse: (!) 105  Resp: (!) 22  Temp: 98.8 F (37.1 C)  SpO2: 95%    General appearance: Alert, appears fatigued, but nontoxic, chronically ill appearing; speaking in full sentences without difficulty HEENT:NCAT; Ears: EACs clear, TMs pearly gray; Eyes: PERRL.  EOM grossly intact. Nose: nares patent without rhinorrhea; Throat: tonsils nonerythematous or enlarged, uvula midline  Neck:  supple without LAD Lungs: harsh rhonchi and wheezes throughout bilateral lung fields; increased respiratory effort; mild cough present Heart: regular rate and rhythm.   Skin: warm and dry Psychological: alert and cooperative; normal mood and affect   ASSESSMENT & PLAN:  1. Cough   2. Acute bronchitis, unspecified organism     Meds ordered this encounter  Medications   predniSONE (STERAPRED UNI-PAK 21 TAB) 10 MG (21) TBPK tablet    Sig: Take by mouth daily. Take 6 tabs by mouth daily  for 2 days, then 5 tabs for 2 days, then 4 tabs for 2 days, then 3 tabs for 2 days, 2 tabs for 2 days, then 1 tab by mouth daily for 2 days    Dispense:  42 tablet    Refill:  0    Order Specific Question:   Supervising Provider    Answer:   Eustace Moore [[id number]]   albuterol (PROVENTIL) (2.5 MG/3ML) 0.083% nebulizer solution     Sig: Take 3 mLs (2.5 mg total) by nebulization every 6 (six) hours as needed for wheezing or shortness of breath.    Dispense:  75 mL    Refill:  12    Order Specific Question:   Supervising Provider    Answer:   Eustace Moore [[id number]]   dexamethasone (DECADRON) injection 10 mg   azithromycin (ZITHROMAX) 250 MG tablet    Sig: Take 1 tablet (250 mg total) by mouth daily. Take first 2 tablets together, then 1 every day until finished.    Dispense:  6 tablet    Refill:  0    Order Specific Question:   Supervising Provider    Answer:   Eustace Moore [[id number]]    No orders of the defined types were placed in this encounter.   Get plenty of rest and push fluids Steroid shot given in office Prednisone prescribed.  Take as directed and to completion Nebulizer solution refilled Azithromycin prescribed.  Take as directed and to completion Follow up with PCP for recheck and/or if symptoms persists Return or go to ER if you have any new or worsening symptoms such as fever, chills, fatigue, shortness of breath, wheezing, chest pain, nausea, changes in bowel or bladder habits, etc...   Reviewed expectations re: course of current medical issues. Questions answered. Outlined signs and symptoms indicating need for more acute intervention. Patient verbalized understanding. After Visit Summary given.           Rennis Harding, PA-C 09/08/21 1224

## 2021-09-08 NOTE — ED Triage Notes (Signed)
Pt presents with cough and sob since Saturday, concerned for bronchitis , needs refill for nebulizer

## 2021-09-08 NOTE — Discharge Instructions (Addendum)
Get plenty of rest and push fluids Steroid shot given in office Prednisone prescribed.  Take as directed and to completion Nebulizer solution refilled Azithromycin prescribed.  Take as directed and to completion Follow up with PCP for recheck and/or if symptoms persists Return or go to ER if you have any new or worsening symptoms such as fever, chills, fatigue, shortness of breath, wheezing, chest pain, nausea, changes in bowel or bladder habits, etc..Marland Kitchen

## 2021-09-26 ENCOUNTER — Ambulatory Visit: Payer: Self-pay | Admitting: Internal Medicine

## 2022-03-13 ENCOUNTER — Emergency Department (HOSPITAL_COMMUNITY): Payer: 59

## 2022-03-13 ENCOUNTER — Other Ambulatory Visit: Payer: Self-pay

## 2022-03-13 ENCOUNTER — Observation Stay (HOSPITAL_COMMUNITY)
Admission: EM | Admit: 2022-03-13 | Discharge: 2022-03-14 | Disposition: A | Discharge status: Home / Self Care | Payer: 59 | Attending: Family Medicine | Admitting: Family Medicine

## 2022-03-13 ENCOUNTER — Encounter (HOSPITAL_COMMUNITY): Payer: Self-pay | Admitting: Emergency Medicine

## 2022-03-13 DIAGNOSIS — R0602 Shortness of breath: Secondary | ICD-10-CM | POA: Diagnosis present

## 2022-03-13 DIAGNOSIS — Z20822 Contact with and (suspected) exposure to covid-19: Secondary | ICD-10-CM | POA: Diagnosis not present

## 2022-03-13 DIAGNOSIS — Z23 Encounter for immunization: Secondary | ICD-10-CM | POA: Insufficient documentation

## 2022-03-13 DIAGNOSIS — I119 Hypertensive heart disease without heart failure: Secondary | ICD-10-CM | POA: Diagnosis not present

## 2022-03-13 DIAGNOSIS — Z8701 Personal history of pneumonia (recurrent): Secondary | ICD-10-CM | POA: Insufficient documentation

## 2022-03-13 DIAGNOSIS — F17210 Nicotine dependence, cigarettes, uncomplicated: Secondary | ICD-10-CM | POA: Diagnosis not present

## 2022-03-13 DIAGNOSIS — R0600 Dyspnea, unspecified: Secondary | ICD-10-CM | POA: Diagnosis not present

## 2022-03-13 DIAGNOSIS — J206 Acute bronchitis due to rhinovirus: Secondary | ICD-10-CM | POA: Diagnosis not present

## 2022-03-13 DIAGNOSIS — J441 Chronic obstructive pulmonary disease with (acute) exacerbation: Secondary | ICD-10-CM | POA: Diagnosis not present

## 2022-03-13 DIAGNOSIS — Z720 Tobacco use: Secondary | ICD-10-CM | POA: Diagnosis present

## 2022-03-13 DIAGNOSIS — Z8709 Personal history of other diseases of the respiratory system: Secondary | ICD-10-CM | POA: Diagnosis not present

## 2022-03-13 DIAGNOSIS — R000 Tachycardia, unspecified: Secondary | ICD-10-CM | POA: Insufficient documentation

## 2022-03-13 DIAGNOSIS — Z7951 Long term (current) use of inhaled steroids: Secondary | ICD-10-CM | POA: Insufficient documentation

## 2022-03-13 DIAGNOSIS — R6889 Other general symptoms and signs: Secondary | ICD-10-CM | POA: Diagnosis not present

## 2022-03-13 DIAGNOSIS — B9789 Other viral agents as the cause of diseases classified elsewhere: Secondary | ICD-10-CM | POA: Diagnosis not present

## 2022-03-13 DIAGNOSIS — R062 Wheezing: Secondary | ICD-10-CM | POA: Diagnosis not present

## 2022-03-13 DIAGNOSIS — J45909 Unspecified asthma, uncomplicated: Secondary | ICD-10-CM | POA: Insufficient documentation

## 2022-03-13 DIAGNOSIS — I10 Essential (primary) hypertension: Secondary | ICD-10-CM | POA: Insufficient documentation

## 2022-03-13 DIAGNOSIS — I493 Ventricular premature depolarization: Secondary | ICD-10-CM | POA: Insufficient documentation

## 2022-03-13 DIAGNOSIS — I498 Other specified cardiac arrhythmias: Secondary | ICD-10-CM | POA: Insufficient documentation

## 2022-03-13 DIAGNOSIS — Z8249 Family history of ischemic heart disease and other diseases of the circulatory system: Secondary | ICD-10-CM | POA: Insufficient documentation

## 2022-03-13 DIAGNOSIS — R0981 Nasal congestion: Secondary | ICD-10-CM | POA: Insufficient documentation

## 2022-03-13 DIAGNOSIS — J440 Chronic obstructive pulmonary disease with acute lower respiratory infection: Secondary | ICD-10-CM | POA: Insufficient documentation

## 2022-03-13 DIAGNOSIS — J40 Bronchitis, not specified as acute or chronic: Secondary | ICD-10-CM

## 2022-03-13 HISTORY — DX: Essential (primary) hypertension: I10

## 2022-03-13 LAB — RESPIRATORY PANEL BY PCR

## 2022-03-13 LAB — CBC WITH DIFFERENTIAL/PLATELET
Abs Immature Granulocytes: 0.03 10*3/uL (ref 0.00–0.07)
Basophils Absolute: 0 10*3/uL (ref 0.0–0.1)
Basophils Relative: 0 %
Eosinophils Absolute: 0 10*3/uL (ref 0.0–0.5)
Eosinophils Relative: 0 %
HCT: 46.1 % (ref 39.0–52.0)
Hemoglobin: 15.8 g/dL (ref 13.0–17.0)
Immature Granulocytes: 0 %
Lymphocytes Relative: 5 %
Lymphs Abs: 0.6 10*3/uL — ABNORMAL LOW (ref 0.7–4.0)
MCH: 32.2 pg (ref 26.0–34.0)
MCHC: 34.3 g/dL (ref 30.0–36.0)
MCV: 94.1 fL (ref 80.0–100.0)
Monocytes Absolute: 0.9 10*3/uL (ref 0.1–1.0)
Monocytes Relative: 9 %
Neutro Abs: 8.8 10*3/uL — ABNORMAL HIGH (ref 1.7–7.7)
Neutrophils Relative %: 86 %
Platelets: 251 10*3/uL (ref 150–400)
RBC: 4.9 MIL/uL (ref 4.22–5.81)
RDW: 12.9 % (ref 11.5–15.5)
WBC: 10.4 10*3/uL (ref 4.0–10.5)
nRBC: 0 % (ref 0.0–0.2)

## 2022-03-13 LAB — COMPREHENSIVE METABOLIC PANEL
ALT: 16 U/L (ref 0–44)
AST: 27 U/L (ref 15–41)
Albumin: 4.4 g/dL (ref 3.5–5.0)
Alkaline Phosphatase: 56 U/L (ref 38–126)
Anion gap: 10 (ref 5–15)
BUN: 10 mg/dL (ref 6–20)
CO2: 23 mmol/L (ref 22–32)
Calcium: 9 mg/dL (ref 8.9–10.3)
Chloride: 101 mmol/L (ref 98–111)
Creatinine, Ser: 1.09 mg/dL (ref 0.61–1.24)
GFR, Estimated: >60 mL/min (ref >60)
Glucose, Bld: 163 mg/dL — ABNORMAL HIGH (ref 70–99)
Potassium: 3.9 mmol/L (ref 3.5–5.1)
Sodium: 134 mmol/L — ABNORMAL LOW (ref 135–145)
Total Bilirubin: 0.6 mg/dL (ref 0.3–1.2)
Total Protein: 7.8 g/dL (ref 6.5–8.1)

## 2022-03-13 LAB — RESP PANEL BY RT-PCR (FLU A&B, COVID) ARPGX2
Influenza A by PCR: NEGATIVE
Influenza B by PCR: NEGATIVE
SARS Coronavirus 2 by RT PCR: NEGATIVE

## 2022-03-13 MED ORDER — ENOXAPARIN SODIUM 40 MG/0.4ML IJ SOSY
40.0000 mg | PREFILLED_SYRINGE | INTRAMUSCULAR | Status: DC
  Administered 2022-03-13: 40 mg via SUBCUTANEOUS
  Filled 2022-03-13: qty 0.4

## 2022-03-13 MED ORDER — AZITHROMYCIN 250 MG PO TABS: 500.0000 mg | ORAL_TABLET | Freq: Every day | ORAL | Status: DC

## 2022-03-13 MED ORDER — NICOTINE 14 MG/24HR TD PT24
14.0000 mg | MEDICATED_PATCH | Freq: Every day | TRANSDERMAL | Status: DC
  Administered 2022-03-13 – 2022-03-14 (×2): 14 mg via TRANSDERMAL
  Filled 2022-03-13 (×2): qty 1

## 2022-03-13 MED ORDER — ALBUTEROL SULFATE (2.5 MG/3ML) 0.083% IN NEBU
2.5000 mg | INHALATION_SOLUTION | Freq: Once | RESPIRATORY_TRACT | Status: AC
  Administered 2022-03-13: 2.5 mg via RESPIRATORY_TRACT
  Filled 2022-03-13: qty 3

## 2022-03-13 MED ORDER — PNEUMOCOCCAL 20-VAL CONJ VACC 0.5 ML IM SUSY
0.5000 mL | PREFILLED_SYRINGE | INTRAMUSCULAR | Status: AC
  Administered 2022-03-14: 0.5 mL via INTRAMUSCULAR
  Filled 2022-03-13: qty 0.5

## 2022-03-13 MED ORDER — SODIUM CHLORIDE 0.9 % IV SOLN
INTRAVENOUS | Status: DC
Start: 2022-03-13 — End: 2022-03-13

## 2022-03-13 MED ORDER — METHYLPREDNISOLONE SODIUM SUCC 125 MG IJ SOLR
125.0000 mg | Freq: Once | INTRAMUSCULAR | Status: AC
  Administered 2022-03-13: 125 mg via INTRAVENOUS
  Filled 2022-03-13: qty 2

## 2022-03-13 MED ORDER — INFLUENZA VAC SPLIT QUAD 0.5 ML IM SUSY
0.5000 mL | PREFILLED_SYRINGE | INTRAMUSCULAR | Status: AC
  Administered 2022-03-14: 0.5 mL via INTRAMUSCULAR
  Filled 2022-03-13: qty 0.5

## 2022-03-13 MED ORDER — IPRATROPIUM-ALBUTEROL 0.5-2.5 (3) MG/3ML IN SOLN
3.0000 mL | Freq: Four times a day (QID) | RESPIRATORY_TRACT | Status: DC
  Administered 2022-03-13: 3 mL via RESPIRATORY_TRACT
  Filled 2022-03-13: qty 6
  Filled 2022-03-13: qty 3

## 2022-03-13 MED ORDER — ALBUTEROL SULFATE (2.5 MG/3ML) 0.083% IN NEBU: 2.5000 mg | INHALATION_SOLUTION | RESPIRATORY_TRACT | Status: DC | PRN

## 2022-03-13 MED ORDER — METHYLPREDNISOLONE SODIUM SUCC 40 MG IJ SOLR
40.0000 mg | Freq: Two times a day (BID) | INTRAMUSCULAR | Status: DC
  Administered 2022-03-13 – 2022-03-14 (×2): 40 mg via INTRAVENOUS
  Filled 2022-03-13 (×2): qty 1

## 2022-03-13 MED ORDER — SODIUM CHLORIDE 0.9 % IV BOLUS
1000.0000 mL | Freq: Once | INTRAVENOUS | Status: AC
  Administered 2022-03-13: 1000 mL via INTRAVENOUS

## 2022-03-13 MED ORDER — SODIUM CHLORIDE 0.9 % IV SOLN
500.0000 mg | INTRAVENOUS | Status: AC
  Administered 2022-03-13: 500 mg via INTRAVENOUS
  Filled 2022-03-13: qty 5

## 2022-03-13 NOTE — ED Provider Notes (Signed)
?[facility] ?Provider Note ? ? ?CSN: [id number] ?Arrival date & time: 03/13/22  0751 ? ?  ? ?History ? ?Chief Complaint  ?Patient presents with  ? Shortness of Breath  ? ? ?Victor Tran is a 56 y.o. male. ? ?Patient presenting with flulike illness.  Symptoms started on Saturday.  He has had fever shortness of breath nonproductive cough.  Patient has a history of bronchitis.  Patient is still a smoker.  Patient's been seen for COPD like symptoms and bronchitis and cough and pneumonia in the past.  Patient smokes a half a pack of cigarettes every day.  Patient does not use any home oxygen.  Past medical history significant for tobacco abuse hypertension asthma. ? ? ?  ? ?Home Medications ?Prior to Admission medications   ?Medication Sig Start Date End Date Taking? Authorizing Provider  ?albuterol (PROVENTIL) (2.5 MG/3ML) 0.083% nebulizer solution Take 3 mLs (2.5 mg total) by nebulization every 6 (six) hours as needed for wheezing or shortness of breath. ?Patient not taking: Reported on 03/13/2022 09/08/21   Alvino Chapel [place], PA-C  ?azithromycin (ZITHROMAX) 250 MG tablet Take 1 tablet (250 mg total) by mouth daily. Take first 2 tablets together, then 1 every day until finished. ?Patient not taking: Reported on 03/13/2022 09/08/21   Alvino Chapel, [place], PA-C  ?predniSONE (STERAPRED UNI-PAK 21 TAB) 10 MG (21) TBPK tablet Take by mouth daily. Take 6 tabs by mouth daily  for 2 days, then 5 tabs for 2 days, then 4 tabs for 2 days, then 3 tabs for 2 days, 2 tabs for 2 days, then 1 tab by mouth daily for 2 days ?Patient not taking: Reported on 03/13/2022 09/08/21   Alvino Chapel [place], PA-C  ?tiotropium (SPIRIVA HANDIHALER) 18 MCG inhalation capsule Place 1 capsule (18 mcg total) into inhaler and inhale daily. ?Patient not taking: Reported on 03/13/2022 09/23/18   Westley Chandler, MD  ?   ? ?Allergies    ?Patient has no known allergies.   ? ?Review of Systems   ?Review of Systems  ?Constitutional:  Positive for fever.  Negative for chills.  ?HENT:  Positive for congestion. Negative for ear pain and sore throat.   ?Eyes:  Negative for pain and visual disturbance.  ?Respiratory:  Positive for cough, shortness of breath and wheezing.   ?Cardiovascular:  Negative for chest pain and palpitations.  ?Gastrointestinal:  Negative for abdominal pain, diarrhea, nausea and vomiting.  ?Genitourinary:  Negative for dysuria and hematuria.  ?Musculoskeletal:  Negative for arthralgias and back pain.  ?Skin:  Negative for color change and rash.  ?Neurological:  Negative for seizures and syncope.  ?All other systems reviewed and are negative. ? ?Physical Exam ?Updated Vital Signs ?BP 135/87   Pulse 96   Temp 99.2 ?F (37.3 ?C) (Oral)   Resp (!) 24   Ht 1.803 m (5\' 11" )   Wt 70.3 kg   SpO2 91%   BMI 21.62 kg/m?  ?Physical Exam ?Vitals and nursing note reviewed.  ?Constitutional:   ?   General: He is not in acute distress. ?   Appearance: Normal appearance. He is well-developed.  ?HENT:  ?   Head: Normocephalic and atraumatic.  ?Eyes:  ?   Extraocular Movements: Extraocular movements intact.  ?   Conjunctiva/sclera: Conjunctivae normal.  ?   Pupils: Pupils are equal, round, and reactive to light.  ?Cardiovascular:  ?   Rate and Rhythm: Normal rate and regular rhythm.  ?   Heart sounds: No murmur heard. ?Pulmonary:  ?  Effort: Pulmonary effort is normal. No respiratory distress.  ?   Breath sounds: Wheezing present.  ?Abdominal:  ?   Palpations: Abdomen is soft.  ?   Tenderness: There is no abdominal tenderness.  ?Musculoskeletal:     ?   General: No swelling.  ?   Cervical back: Normal range of motion and neck supple.  ?Skin: ?   General: Skin is warm and dry.  ?   Capillary Refill: Capillary refill takes less than 2 seconds.  ?Neurological:  ?   General: No focal deficit present.  ?   Mental Status: He is alert and oriented to person, place, and time.  ?   Cranial Nerves: No cranial nerve deficit.  ?   Sensory: No sensory deficit.  ?   Motor:  No weakness.  ?Psychiatric:     ?   Mood and Affect: Mood normal.  ? ? ?ED Results / Procedures / Treatments   ?Labs ?(all labs ordered are listed, but only abnormal results are displayed) ?Labs Reviewed  ?COMPREHENSIVE METABOLIC PANEL - Abnormal; Notable for the following components:  ?    Result Value  ? Sodium 134 (*)   ? Glucose, Bld 163 (*)   ? All other components within normal limits  ?CBC WITH DIFFERENTIAL/PLATELET - Abnormal; Notable for the following components:  ? Neutro Abs 8.8 (*)   ? Lymphs Abs 0.6 (*)   ? All other components within normal limits  ?RESP PANEL BY RT-PCR (FLU A&B, COVID) [id number]  ? ? ?EKG ?EKG Interpretation ? ?Date/Time:  Monday March 13 2022 08:07:32 EDT ?Ventricular Rate:  132 ?PR Interval:  141 ?QRS Duration: 93 ?QT Interval:  310 ?QTC Calculation: 458 ?R Axis:   92 ?Text Interpretation: Sinus tachycardia Multiple premature complexes, vent & supraven LAE, consider biatrial enlargement Probable lateral infarct, old Artifact in lead(s) I II III aVR aVL aVF V1 V2 V5 V6 Confirmed by Vanetta Mulders [phone]) on 03/13/2022 8:18:19 AM ? ?Radiology ?DG Chest Port 1 View ? ?Result Date: 03/13/2022 ?CLINICAL DATA:  Shortness of breath EXAM: PORTABLE CHEST 1 VIEW COMPARISON:  Chest radiograph dated March 07, 2021 FINDINGS: The heart size and mediastinal contours are within normal limits. Hyperinflated lungs. No focal consolidation or pleural effusion. The visualized skeletal structures are unremarkable. IMPRESSION: Hyperinflated lungs without evidence of acute cardiopulmonary process. Electronically Signed   By: Larose Hires D.O.   On: 03/13/2022 08:45   ? ?Procedures ?Procedures  ? ? ?Medications Ordered in ED ?Medications  ?0.9 %  sodium chloride infusion ( Intravenous New Bag/Given 03/13/22 0839)  ?albuterol (PROVENTIL) (2.5 MG/3ML) 0.083% nebulizer solution 2.5 mg (has no administration in time range)  ?methylPREDNISolone sodium succinate (SOLU-MEDROL) 125 mg/2 mL injection 125 mg (has no  administration in time range)  ?albuterol (PROVENTIL) (2.5 MG/3ML) 0.083% nebulizer solution 2.5 mg (2.5 mg Nebulization Given 03/13/22 0924)  ?sodium chloride 0.9 % bolus 1,000 mL (0 mLs Intravenous Stopped 03/13/22 1014)  ?albuterol (PROVENTIL) (2.5 MG/3ML) 0.083% nebulizer solution 2.5 mg (2.5 mg Nebulization Given 03/13/22 1138)  ? ? ?ED Course/ Medical Decision Making/ A&P ?  ?                        ?Medical Decision Making ?Amount and/or Complexity of Data Reviewed ?Labs: ordered. ?Radiology: ordered. ? ?Risk ?Prescription drug management. ?Decision regarding hospitalization. ? ?CRITICAL CARE ?Performed by: Vanetta Mulders ?Total critical care time: 35 minutes ?Critical care time was exclusive of separately billable procedures and treating  other patients. ?Critical care was necessary to treat or prevent imminent or life-threatening deterioration. ?Critical care was time spent personally by me on the following activities: development of treatment plan with patient and/or surrogate as well as nursing, discussions with consultants, evaluation of patient's response to treatment, examination of patient, obtaining history from patient or surrogate, ordering and performing treatments and interventions, ordering and review of laboratory studies, ordering and review of radiographic studies, pulse oximetry and re-evaluation of patient's condition. ? ?Patient never been hypoxic.  Room air oxygen sats have been good.  The patient has had persistent wheezing that is improved some with nebulizers but now will not completely resolved.  No leukocytosis.  Complete metabolic panel normal including liver function test.  COVID influenza panel negative.  Chest x-ray hyperinflated lungs without evidence of acute coronary pulmonary process, such as pneumonia. ? ?Patient smokes a half a pack a day.  Not formally diagnosed with COPD but definitely has been diagnosed with asthma in the past.  Wheezing persist despite 2 nebulizer  treatments.  Will give steroids third nebulizer and call hospitalist for admission. ? ? ?Final Clinical Impression(s) / ED Diagnoses ?Final diagnoses:  ?COPD exacerbation (HCC)  ?Bronchitis  ?SOB (shortness of breath)  ?Fl

## 2022-03-13 NOTE — ED Notes (Signed)
Patient O2 sat dropping in low 90s with increase shortness of breath, placed pt on 1liter of oxygen. ?

## 2022-03-13 NOTE — ED Notes (Signed)
Pt states he is supposed to be taking medicine for bp but has not taken in about a week ago.  ?

## 2022-03-13 NOTE — Assessment & Plan Note (Addendum)
Precipitated by rhinovirus infection. CXR clear.  ?- Continue steroids x5 days with prednisone 40mg  at home ?- Continue albuterol neb RTC x48 hours then prn ?- Complete 5 day course azithromycin ?- Needs to establish with PCP, formal PFTs after resolution of acute illness. ?

## 2022-03-13 NOTE — ED Notes (Signed)
EDP notified of elevated HR and BP. See new orders.  ?

## 2022-03-13 NOTE — Assessment & Plan Note (Addendum)
1/2 ppd for many many years. Contemplative during cessation counseling today.  ?- PCP follow up recommended.  ?

## 2022-03-13 NOTE — H&P (Signed)
?History and Physical  ? ? ?Patient: Victor Tran [id number] DOB: Apr 15, 1966 ?DOA: 03/13/2022 ?DOS: the patient was seen and examined on 03/13/2022 ?PCP: Pcp, No  ?Patient coming from: Home ? ?Chief Complaint:  ?Chief Complaint  ?Patient presents with  ? Shortness of Breath  ? ?HPI: Victor Tran is a 56 y.o. male with a history of tobacco use and suspected COPD with exacerbations who presented to the ED 3/27 with progressive worsening, severe wheezing despite taking albuterol inhaler at home. Symptoms began 2 days ago with wheezing, mild nasal congestion, yesterday got a fever and today rapidly worsening dyspnea at rest. He was noted to be wheezing and tachypneic in the ED. This improved, but did not resolve, with IV steroid and continuous breathing treatments. At the time of my evaluation he continues to have inspiratory and expiratory wheezing and is amenable to admission. He's never needed to be intubated or in the ICU. Takes no meds, has NKDA (just seasonal allergies), no sick contacts. Covid, flu negative, CXR without infiltrate.   ?Review of Systems: As mentioned in the history of present illness. All other systems reviewed and are negative. ?Past Medical History:  ?Diagnosis Date  ? Allergy   ? Asthma   ? Hypertension   ? Nephrolithiasis   ? Tobacco abuse   ? ?Past Surgical History:  ?Procedure Laterality Date  ? EYE SURGERY    ? ?Social History:  reports that he has been smoking cigarettes. He has been smoking an average of .5 packs per day. He has never used smokeless tobacco. He reports current alcohol use of about 21.0 standard drinks per week. He reports that he does not use drugs. ? ?No Known Allergies ? ?Family History  ?Problem Relation Age of Onset  ? Diabetes Mother   ? Hypertension Mother   ? Heart disease Mother   ? Stroke Father   ? Heart disease Father   ? Diabetes Father   ? HIV/AIDS Brother   ? Cancer Brother 19  ?     Colon Cancer  ? ?Meds: Takes none regularly. PCP in 2019 seems to have  started spiriva. ? ?Physical Exam: ?Vitals:  ? 03/13/22 1200 03/13/22 1245 03/13/22 1315 03/13/22 1443  ?BP: 139/83 135/82 135/87   ?Pulse: 93 88 96   ?Resp: (!) 27 (!) 28 (!) 24   ?Temp:      ?TempSrc:      ?SpO2: 94% 93% 91% 92%  ?Weight:      ?Height:      ?Gen: 56 y.o. male in no distress ?Pulm: Nonlabored , very hoarse voice, talking brings on coughing spells that are nonproductive. No stridor. Inspiratory and expiratory wheezing though air movement overall is good. RR in low 20's. ?CV: Regular rate and rhythm. No murmur, rub, or gallop. No JVD, no dependent edema. ?GI: Abdomen soft, non-tender, non-distended, with normoactive bowel sounds.  ?Ext: Warm, no deformities ?Skin: No rashes, lesions or ulcers on visualized skin. ?Neuro: Alert and oriented. No focal neurological deficits. ?Psych: Judgement and insight appear fair. Mood euthymic & affect congruent. Behavior is appropriate. ?   ?Data Reviewed: ?ECG with sinus tachycardia with sinus arrhythmia, limited interpretation with diffuse artifact. ?CXR: Hyperinflated without infiltrate ?Covid, flu PCR: Negative ?CBC normal ?CMP: Glucose is 163, Na 134. Otherwise wnl. ? ?Assessment and Plan: ?* COPD exacerbation ([place]) ?Exam very consistent with this diagnosis. Pt has allergies and continues to smoke, otherwise, unclear precipitant.  ?- Continue IV steroids ?- Duoneb q6h + prn albuterol ?- Empiric azithromycin.  ?-  RT consulted ?- IS, FV ?- Check full RVP ? ?Tobacco use ?1/2ppd for many many years. Contemplative during cessation counseling today.  ?- PCP follow up recommended.  ?- Nicotine patch ? ?Advance Care Planning: Full code ? ?Consults: None ? ?Family Communication: None at bedside ? ?Severity of Illness: ?The appropriate patient status for this patient is OBSERVATION. Observation status is judged to be reasonable and necessary in order to provide the required intensity of service to ensure the patient's safety. The patient's presenting symptoms, physical  exam findings, and initial radiographic and laboratory data in the context of their medical condition is felt to place them at decreased risk for further clinical deterioration. Furthermore, it is anticipated that the patient will be medically stable for discharge from the hospital within 2 midnights of admission.  ? ?Author: ?Patrecia Pour, MD ?03/13/2022 4:20 PM ? ?For on call review [email or web address].  ?

## 2022-03-13 NOTE — ED Triage Notes (Signed)
Pt arrives pov with c/o SOB that started Saturday. He states he has a cough as well and was running 101F temp yesterday. HR noted to be elevated 130s at this time. Reports hx of bronchitis.  ?

## 2022-03-14 DIAGNOSIS — J441 Chronic obstructive pulmonary disease with (acute) exacerbation: Secondary | ICD-10-CM | POA: Diagnosis not present

## 2022-03-14 DIAGNOSIS — B348 Other viral infections of unspecified site: Secondary | ICD-10-CM | POA: Diagnosis not present

## 2022-03-14 DIAGNOSIS — Z720 Tobacco use: Secondary | ICD-10-CM | POA: Diagnosis not present

## 2022-03-14 LAB — HIV ANTIBODY (ROUTINE TESTING W REFLEX): HIV Screen 4th Generation wRfx: NONREACTIVE

## 2022-03-14 MED ORDER — ALBUTEROL SULFATE (2.5 MG/3ML) 0.083% IN NEBU: 2.5000 mg | INHALATION_SOLUTION | RESPIRATORY_TRACT | 1 refills | Status: DC | PRN

## 2022-03-14 MED ORDER — PREDNISONE 20 MG PO TABS: 40.0000 mg | ORAL_TABLET | Freq: Every day | ORAL | 0 refills | Status: AC

## 2022-03-14 MED ORDER — ALBUTEROL SULFATE (2.5 MG/3ML) 0.083% IN NEBU: 2.5000 mg | INHALATION_SOLUTION | RESPIRATORY_TRACT | Status: DC | PRN

## 2022-03-14 MED ORDER — AZITHROMYCIN 250 MG PO TABS: 250.0000 mg | ORAL_TABLET | Freq: Every day | ORAL | 0 refills | Status: AC

## 2022-03-14 NOTE — Plan of Care (Signed)
Patient educated on care plan and discharge instructions. Flu & PNA vaccines given prior to discharge on patient request. All questions answered. ?

## 2022-03-14 NOTE — Discharge Summary (Signed)
?Physician Discharge Summary ?  ?Patient: Victor Tran MRN: [id number] DOB: Nov 08, 1966  ?Admit date:     03/13/2022  ?Discharge date: 03/14/22  ?Discharge Physician: Patrecia Pour  ? ?PCP: Pcp, No  ? ?Recommendations at discharge:  ?Establish PCP care. Suggest PFTs, medications for suspected COPD, and tobacco cessation counseling/medications assistance.  ? ?Discharge Diagnoses: ?Principal Problem: ?  COPD exacerbation ([place]) ?Active Problems: ?  Tobacco use ? ?Hospital Course: ?Victor Tran is a 56 y.o. male with a history of tobacco use and suspected COPD with exacerbations who presented to the ED 3/27 with progressive worsening, severe wheezing. Labs were reassuring. Covid, flu negative, rhinovirus positive, CXR without infiltrate. He was tachypneic with wheezing despite several nebulized treatments, so was observed overnight with continued IV steroids, around the clock nebulizer treatments, and empiric azithromycin. He has improved, is not hypoxic, and requests discharge. He will continue steroids, azithromycin, and albuterol through (new) nebulizer machine at home.  ? ?Assessment and Plan: ?* COPD exacerbation ([place]) ?Precipitated by rhinovirus infection. CXR clear.  ?- Continue steroids x5 days with prednisone 40mg  at home ?- Continue albuterol neb RTC x48 hours then prn ?- Complete 5 day course azithromycin ?- Needs to establish with PCP, formal PFTs after resolution of acute illness. ? ?Tobacco use ?1/2 ppd for many many years. Contemplative during cessation counseling today.  ?- PCP follow up recommended.  ? ?Consultants: None ?Procedures performed: None  ?Disposition: Home ?Diet recommendation:  ?Regular diet ?DISCHARGE MEDICATION: ?Allergies as of 03/14/2022   ?No Known Allergies ?  ? ?  ?Medication List  ?  ? ?STOP taking these medications   ? ?predniSONE 10 MG (21) Tbpk tablet ?Commonly known as: STERAPRED UNI-PAK 21 TAB ?Replaced by: predniSONE 20 MG tablet ?  ?tiotropium 18 MCG inhalation  capsule ?Commonly known as: Spiriva HandiHaler ?  ? ?  ? ?TAKE these medications   ? ?albuterol (2.5 MG/3ML) 0.083% nebulizer solution ?Commonly known as: PROVENTIL ?Take 3 mLs (2.5 mg total) by nebulization every 4 (four) hours as needed for wheezing or shortness of breath. ?What changed: when to take this ?  ?azithromycin 250 MG tablet ?Commonly known as: ZITHROMAX ?Take 1 tablet (250 mg total) by mouth daily for 3 doses. ?Start taking on: March 15, 2022 ?What changed: additional instructions ?  ?predniSONE 20 MG tablet ?Commonly known as: DELTASONE ?Take 2 tablets (40 mg total) by mouth daily for 3 days. ?Start taking on: March 15, 2022 ?Replaces: predniSONE 10 MG (21) Tbpk tablet ?  ? ?  ? ?  ?  ? ? ?  ?Durable Medical Equipment  ?(From admission, onward)  ?  ? ? ?  ? ?  Start     Ordered  ? 03/14/22 0802  For home use only DME Nebulizer/meds  Once       ?Question Answer Comment  ?Patient needs a nebulizer to treat with the following condition COPD exacerbation ([place])   ?Length of Need 6 Months   ?  ? 03/14/22 0801  ? ?  ?  ? ?  ? ?Subjective: Seen this morning, reporting significant improvement in dyspnea and wheezing and cough. No chest pain or new issues. No fevers. Wants to go home today.  ? ?Discharge Exam: ?BP 126/81 (BP Location: Left Arm)   Pulse 71   Temp (!) 97 ?F (36.1 ?C)   Resp 19   Ht 5\' 11"  (1.803 m)   Wt 70.3 kg   SpO2 94%   BMI 21.62 kg/m?   ?No  distress ?Nonlabored on room air, normal rate and effort, end-expiratory wheezing (last breathing treatment was last night) ?RRR no JVD or edema ? ?Condition at discharge: stable ? ?The results of significant diagnostics from this hospitalization (including imaging, microbiology, ancillary and laboratory) are listed below for reference.  ? ?Imaging Studies: ?DG Chest Port 1 View ? ?Result Date: 03/13/2022 ?CLINICAL DATA:  Shortness of breath EXAM: PORTABLE CHEST 1 VIEW COMPARISON:  Chest radiograph dated March 07, 2021 FINDINGS: The heart size and  mediastinal contours are within normal limits. Hyperinflated lungs. No focal consolidation or pleural effusion. The visualized skeletal structures are unremarkable. IMPRESSION: Hyperinflated lungs without evidence of acute cardiopulmonary process. Electronically Signed   By: Keane Police D.O.   On: 03/13/2022 08:45   ? ?Microbiology: ?Results for orders placed or performed during the hospital encounter of 03/13/22  ?Resp Panel by RT-PCR (Flu A&B, Covid) Nasopharyngeal Swab     Status: None  ? Collection Time: 03/13/22  8:12 AM  ? Specimen: Nasopharyngeal Swab; Nasopharyngeal(NP) swabs in vial transport medium  ?Result Value Ref Range Status  ? SARS Coronavirus 2 by RT PCR NEGATIVE NEGATIVE Final  ?  Comment: (NOTE) ?SARS-CoV-2 target nucleic acids are NOT DETECTED. ? ?The SARS-CoV-2 RNA is generally detectable in upper respiratory ?specimens during the acute phase of infection. The lowest ?concentration of SARS-CoV-2 viral copies this assay can detect is ?138 copies/mL. A negative result does not preclude SARS-Cov-2 ?infection and should not be used as the sole basis for treatment or ?other patient management decisions. A negative result may occur with  ?improper specimen collection/handling, submission of specimen other ?than nasopharyngeal swab, presence of viral mutation(s) within the ?areas targeted by this assay, and inadequate number of viral ?copies(<138 copies/mL). A negative result must be combined with ?clinical observations, patient history, and epidemiological ?information. The expected result is Negative. ? ?Fact Sheet for Patients:  ?EntrepreneurPulse.com.au ? ?Fact Sheet for Healthcare Providers:  ?IncredibleEmployment.be ? ?This test is no t yet approved or cleared by the [place] FDA and  ?has been authorized for detection and/or diagnosis of SARS-CoV-2 by ?FDA under an Emergency Use Authorization (EUA). This EUA will remain  ?in effect (meaning this test can  be used) for the duration of the ?COVID-19 declaration under Section 564(b)(1) of the Act, 21 ?U.S.C.section 360bbb-3(b)(1), unless the authorization is terminated  ?or revoked sooner.  ? ? ?  ? Influenza A by PCR NEGATIVE NEGATIVE Final  ? Influenza B by PCR NEGATIVE NEGATIVE Final  ?  Comment: (NOTE) ?The Xpert Xpress SARS-CoV-2/FLU/RSV plus assay is intended as an aid ?in the diagnosis of influenza from Nasopharyngeal swab specimens and ?should not be used as a sole basis for treatment. Nasal washings and ?aspirates are unacceptable for Xpert Xpress SARS-CoV-2/FLU/RSV ?testing. ? ?Fact Sheet for Patients: ?EntrepreneurPulse.com.au ? ?Fact Sheet for Healthcare Providers: ?IncredibleEmployment.be ? ?This test is not yet approved or cleared by the [place] FDA and ?has been authorized for detection and/or diagnosis of SARS-CoV-2 by ?FDA under an Emergency Use Authorization (EUA). This EUA will remain ?in effect (meaning this test can be used) for the duration of the ?COVID-19 declaration under Section 564(b)(1) of the Act, 21 U.S.C. ?section 360bbb-3(b)(1), unless the authorization is terminated or ?revoked. ? ?Performed at [facility], [address] [place], [place] [address] ?  ?Respiratory (~20 pathogens) panel by PCR     Status: Abnormal  ? Collection Time: 03/13/22  8:12 AM  ? Specimen: Nasopharyngeal Swab; Respiratory  ?Result Value Ref Range  Status  ? Adenovirus NOT DETECTED NOT DETECTED Final  ? Coronavirus 229E NOT DETECTED NOT DETECTED Final  ?  Comment: (NOTE) ?The Coronavirus on the Respiratory Panel, DOES NOT test for the novel  ?Coronavirus (2019 nCoV) ?  ? Coronavirus HKU1 NOT DETECTED NOT DETECTED Final  ? Coronavirus NL63 NOT DETECTED NOT DETECTED Final  ? Coronavirus OC43 NOT DETECTED NOT DETECTED Final  ? Metapneumovirus NOT DETECTED NOT DETECTED Final  ? Rhinovirus / Enterovirus DETECTED (A) NOT DETECTED Final  ? Influenza A NOT DETECTED NOT DETECTED  Final  ? Influenza B NOT DETECTED NOT DETECTED Final  ? Parainfluenza Virus 1 NOT DETECTED NOT DETECTED Final  ? Parainfluenza Virus 2 NOT DETECTED NOT DETECTED Final  ? Parainfluenza Virus 3 NOT DETECTED NOT DETECTED Final  ? P

## 2022-03-17 NOTE — Progress Notes (Signed)
?  March 17, 2022 ? ?Patient: Victor Tran  ?Date of Birth: 1966-06-29  ?Date of Visit: 03/13/2022  ? ? ?To Whom It May Concern: ? ?Aaryn Sermon was seen and treated at [facility] on 03/13/2022 until 03/14/22. Aerik Polan may return back to work immediately. ? ?Sincerely,  ? ? ? ? ? ?Nevin Bloodgood, MD  ? ? ? ? ?

## 2022-03-20 ENCOUNTER — Emergency Department (HOSPITAL_COMMUNITY): Payer: 59

## 2022-03-20 ENCOUNTER — Encounter (HOSPITAL_COMMUNITY): Payer: Self-pay | Admitting: *Deleted

## 2022-03-20 ENCOUNTER — Inpatient Hospital Stay (HOSPITAL_COMMUNITY)
Admission: EM | Admit: 2022-03-20 | Discharge: 2022-03-22 | DRG: 190 | Disposition: A | Discharge status: Home / Self Care | Payer: 59 | Attending: Internal Medicine | Admitting: Internal Medicine

## 2022-03-20 ENCOUNTER — Other Ambulatory Visit: Payer: Self-pay

## 2022-03-20 DIAGNOSIS — Z823 Family history of stroke: Secondary | ICD-10-CM

## 2022-03-20 DIAGNOSIS — Z833 Family history of diabetes mellitus: Secondary | ICD-10-CM

## 2022-03-20 DIAGNOSIS — Z720 Tobacco use: Secondary | ICD-10-CM | POA: Diagnosis not present

## 2022-03-20 DIAGNOSIS — J9601 Acute respiratory failure with hypoxia: Secondary | ICD-10-CM | POA: Diagnosis present

## 2022-03-20 DIAGNOSIS — F17210 Nicotine dependence, cigarettes, uncomplicated: Secondary | ICD-10-CM | POA: Diagnosis present

## 2022-03-20 DIAGNOSIS — J441 Chronic obstructive pulmonary disease with (acute) exacerbation: Secondary | ICD-10-CM | POA: Diagnosis not present

## 2022-03-20 DIAGNOSIS — Z79899 Other long term (current) drug therapy: Secondary | ICD-10-CM

## 2022-03-20 DIAGNOSIS — Z20822 Contact with and (suspected) exposure to covid-19: Secondary | ICD-10-CM | POA: Diagnosis present

## 2022-03-20 DIAGNOSIS — Z8249 Family history of ischemic heart disease and other diseases of the circulatory system: Secondary | ICD-10-CM

## 2022-03-20 DIAGNOSIS — Z800 Family history of malignant neoplasm of digestive organs: Secondary | ICD-10-CM

## 2022-03-20 LAB — CBC WITH DIFFERENTIAL/PLATELET
Abs Immature Granulocytes: 0.08 10*3/uL — ABNORMAL HIGH (ref 0.00–0.07)
Basophils Absolute: 0.1 10*3/uL (ref 0.0–0.1)
Basophils Relative: 1 %
Eosinophils Absolute: 0.1 10*3/uL (ref 0.0–0.5)
Eosinophils Relative: 1 %
HCT: 41.5 % (ref 39.0–52.0)
Hemoglobin: 14.6 g/dL (ref 13.0–17.0)
Immature Granulocytes: 1 %
Lymphocytes Relative: 9 %
Lymphs Abs: 1.2 10*3/uL (ref 0.7–4.0)
MCH: 32.4 pg (ref 26.0–34.0)
MCHC: 35.2 g/dL (ref 30.0–36.0)
MCV: 92 fL (ref 80.0–100.0)
Monocytes Absolute: 1.2 10*3/uL — ABNORMAL HIGH (ref 0.1–1.0)
Monocytes Relative: 9 %
Neutro Abs: 10.5 10*3/uL — ABNORMAL HIGH (ref 1.7–7.7)
Neutrophils Relative %: 79 %
Platelets: 377 10*3/uL (ref 150–400)
RBC: 4.51 MIL/uL (ref 4.22–5.81)
RDW: 12.5 % (ref 11.5–15.5)
WBC: 13.2 10*3/uL — ABNORMAL HIGH (ref 4.0–10.5)
nRBC: 0 % (ref 0.0–0.2)

## 2022-03-20 LAB — COMPREHENSIVE METABOLIC PANEL
ALT: 18 U/L (ref 0–44)
AST: 21 U/L (ref 15–41)
Albumin: 3.5 g/dL (ref 3.5–5.0)
Alkaline Phosphatase: 46 U/L (ref 38–126)
Anion gap: 11 (ref 5–15)
BUN: 12 mg/dL (ref 6–20)
CO2: 22 mmol/L (ref 22–32)
Calcium: 8.9 mg/dL (ref 8.9–10.3)
Chloride: 102 mmol/L (ref 98–111)
Creatinine, Ser: 0.99 mg/dL (ref 0.61–1.24)
GFR, Estimated: >60 mL/min (ref >60)
Glucose, Bld: 212 mg/dL — ABNORMAL HIGH (ref 70–99)
Potassium: 3.3 mmol/L — ABNORMAL LOW (ref 3.5–5.1)
Sodium: 135 mmol/L (ref 135–145)
Total Bilirubin: 0.6 mg/dL (ref 0.3–1.2)
Total Protein: 7 g/dL (ref 6.5–8.1)

## 2022-03-20 LAB — RESP PANEL BY RT-PCR (FLU A&B, COVID) ARPGX2
Influenza A by PCR: NEGATIVE
Influenza B by PCR: NEGATIVE
SARS Coronavirus 2 by RT PCR: NEGATIVE

## 2022-03-20 LAB — MAGNESIUM: Magnesium: 2 mg/dL (ref 1.7–2.4)

## 2022-03-20 MED ORDER — LACTATED RINGERS IV BOLUS
1000.0000 mL | Freq: Once | INTRAVENOUS | Status: AC
  Administered 2022-03-20: 1000 mL via INTRAVENOUS

## 2022-03-20 MED ORDER — ENOXAPARIN SODIUM 40 MG/0.4ML IJ SOSY
40.0000 mg | PREFILLED_SYRINGE | INTRAMUSCULAR | Status: DC
  Administered 2022-03-20 – 2022-03-21 (×2): 40 mg via SUBCUTANEOUS
  Filled 2022-03-20 (×2): qty 0.4

## 2022-03-20 MED ORDER — PREDNISONE 20 MG PO TABS
40.0000 mg | ORAL_TABLET | Freq: Every day | ORAL | Status: DC
  Administered 2022-03-22: 40 mg via ORAL
  Filled 2022-03-20: qty 2

## 2022-03-20 MED ORDER — ALBUTEROL SULFATE (2.5 MG/3ML) 0.083% IN NEBU
2.5000 mg | INHALATION_SOLUTION | Freq: Once | RESPIRATORY_TRACT | Status: AC
Start: 2022-03-20 — End: 2022-03-20
  Administered 2022-03-20: 2.5 mg via RESPIRATORY_TRACT
  Filled 2022-03-20: qty 3

## 2022-03-20 MED ORDER — SODIUM CHLORIDE 0.9 % IV SOLN
1.0000 g | INTRAVENOUS | Status: DC
  Administered 2022-03-20: 1 g via INTRAVENOUS
  Filled 2022-03-20: qty 10

## 2022-03-20 MED ORDER — ALBUTEROL (5 MG/ML) CONTINUOUS INHALATION SOLN: 10.0000 mg/h | INHALATION_SOLUTION | Freq: Once | RESPIRATORY_TRACT | Status: DC

## 2022-03-20 MED ORDER — POTASSIUM CHLORIDE CRYS ER 20 MEQ PO TBCR
40.0000 meq | EXTENDED_RELEASE_TABLET | Freq: Once | ORAL | Status: AC
  Administered 2022-03-20: 40 meq via ORAL
  Filled 2022-03-20: qty 2

## 2022-03-20 MED ORDER — IPRATROPIUM-ALBUTEROL 0.5-2.5 (3) MG/3ML IN SOLN
3.0000 mL | Freq: Four times a day (QID) | RESPIRATORY_TRACT | Status: AC
  Administered 2022-03-20 – 2022-03-21 (×4): 3 mL via RESPIRATORY_TRACT
  Filled 2022-03-20 (×4): qty 3

## 2022-03-20 MED ORDER — IPRATROPIUM-ALBUTEROL 0.5-2.5 (3) MG/3ML IN SOLN
3.0000 mL | Freq: Once | RESPIRATORY_TRACT | Status: AC
  Administered 2022-03-20: 3 mL via RESPIRATORY_TRACT
  Filled 2022-03-20: qty 3

## 2022-03-20 MED ORDER — IPRATROPIUM-ALBUTEROL 0.5-2.5 (3) MG/3ML IN SOLN
3.0000 mL | RESPIRATORY_TRACT | Status: DC | PRN
Start: 2022-03-20 — End: 2022-03-22
  Administered 2022-03-21: 3 mL via RESPIRATORY_TRACT
  Filled 2022-03-20: qty 3

## 2022-03-20 MED ORDER — IPRATROPIUM BROMIDE 0.02 % IN SOLN
0.5000 mg | Freq: Once | RESPIRATORY_TRACT | Status: AC
  Administered 2022-03-20: 0.5 mg via RESPIRATORY_TRACT
  Filled 2022-03-20: qty 2.5

## 2022-03-20 MED ORDER — ALBUTEROL SULFATE (2.5 MG/3ML) 0.083% IN NEBU
INHALATION_SOLUTION | RESPIRATORY_TRACT | Status: AC
  Administered 2022-03-20: 10 mg
  Filled 2022-03-20: qty 12

## 2022-03-20 MED ORDER — ACETAMINOPHEN 650 MG RE SUPP: 650.0000 mg | Freq: Four times a day (QID) | RECTAL | Status: DC | PRN

## 2022-03-20 MED ORDER — POLYETHYLENE GLYCOL 3350 17 G PO PACK: 17.0000 g | PACK | Freq: Every day | ORAL | Status: DC | PRN

## 2022-03-20 MED ORDER — GUAIFENESIN-DM 100-10 MG/5ML PO SYRP
10.0000 mL | ORAL_SOLUTION | Freq: Three times a day (TID) | ORAL | Status: AC
  Administered 2022-03-20 – 2022-03-21 (×3): 10 mL via ORAL
  Filled 2022-03-20 (×3): qty 10

## 2022-03-20 MED ORDER — ONDANSETRON HCL 4 MG PO TABS: 4.0000 mg | ORAL_TABLET | Freq: Four times a day (QID) | ORAL | Status: DC | PRN

## 2022-03-20 MED ORDER — ONDANSETRON HCL 4 MG/2ML IJ SOLN: 4.0000 mg | Freq: Four times a day (QID) | INTRAMUSCULAR | Status: DC | PRN

## 2022-03-20 MED ORDER — METHYLPREDNISOLONE SODIUM SUCC 125 MG IJ SOLR
120.0000 mg | INTRAMUSCULAR | Status: AC
  Administered 2022-03-21: 120 mg via INTRAVENOUS
  Filled 2022-03-20: qty 2

## 2022-03-20 MED ORDER — ACETAMINOPHEN 325 MG PO TABS: 650.0000 mg | ORAL_TABLET | Freq: Four times a day (QID) | ORAL | Status: DC | PRN

## 2022-03-20 MED ORDER — METHYLPREDNISOLONE SODIUM SUCC 125 MG IJ SOLR
125.0000 mg | Freq: Once | INTRAMUSCULAR | Status: AC
  Administered 2022-03-20: 125 mg via INTRAVENOUS
  Filled 2022-03-20: qty 2

## 2022-03-20 NOTE — ED Triage Notes (Signed)
Shortness of breath for over a week, states it is getting worse ?

## 2022-03-20 NOTE — Assessment & Plan Note (Addendum)
Currently smokes half a pack of cigarettes daily. ?-I have counseled him on quitting, states he is ready to quit. ?

## 2022-03-20 NOTE — Assessment & Plan Note (Addendum)
Dyspnea, productive cough, rhonchi present, chest x-ray clear.  COVID test negative.  Recent hospitalization for COPD exacerbation precipitated by rhinovirus.  With acute respiratory failure today, O2 sat 88% on room air, currently on 2 L sats greater than 90%. ?-DuoNebs as needed and scheduled ?-IV Solu-Medrol 125 mg given, continue 60 twice daily ?-Mucolytics ?-IV ceftriaxone ?- Daily Fasting blood sugars while on steroids ?

## 2022-03-20 NOTE — ED Provider Notes (Signed)
?[facility] ?Provider Note ? ? ?CSN: [id number] ?Arrival date & time: 03/20/22  1040 ? ?  ? ?History ? ?Chief Complaint  ?Patient presents with  ? Shortness of Breath  ? ? ?Victor Tran is a 56 y.o. male. ? ?HPI ?55 year old male with a history of suspected COPD and tobacco abuse presents with shortness of breath.  He was admitted last week for the same overnight and states that he does not feel like he is gotten any better.  He completed the prednisone and albuterol.  Still has a cough that is occasionally productive of green sputum.  Is still short of breath.  He denies any fevers, sore throat, chest pain or leg swelling. ? ?Home Medications ?Prior to Admission medications   ?Medication Sig Start Date End Date Taking? Authorizing Provider  ?albuterol (PROVENTIL) (2.5 MG/3ML) 0.083% nebulizer solution Take 3 mLs (2.5 mg total) by nebulization every 4 (four) hours as needed for wheezing or shortness of breath. 03/14/22  Yes Patrecia Pour, MD  ?   ? ?Allergies    ?Patient has no known allergies.   ? ?Review of Systems   ?Review of Systems  ?Constitutional:  Negative for fever.  ?HENT:  Negative for sore throat.   ?Respiratory:  Positive for cough and shortness of breath.   ?Cardiovascular:  Negative for chest pain and leg swelling.  ? ?Physical Exam ?Updated Vital Signs ?BP 123/73   Pulse 94   Temp 98.8 ?F (37.1 ?C) (Oral)   Resp 19   Ht 5\' 11"  (1.803 m)   Wt 70.3 kg   SpO2 90%   BMI 21.62 kg/m?  ?Physical Exam ?Vitals and nursing note reviewed.  ?Constitutional:   ?   Appearance: He is well-developed. He is not ill-appearing or diaphoretic.  ?HENT:  ?   Head: Normocephalic and atraumatic.  ?Cardiovascular:  ?   Rate and Rhythm: Regular rhythm. Tachycardia present.  ?   Heart sounds: Normal heart sounds.  ?Pulmonary:  ?   Effort: Pulmonary effort is normal. Tachypnea present. No accessory muscle usage.  ?   Breath sounds: Wheezing (diffuse, expiratory) present.  ?Abdominal:  ?   Palpations:  Abdomen is soft.  ?   Tenderness: There is no abdominal tenderness.  ?Musculoskeletal:  ?   Right lower leg: No edema.  ?   Left lower leg: No edema.  ?Skin: ?   General: Skin is warm and dry.  ?Neurological:  ?   Mental Status: He is alert.  ? ? ?ED Results / Procedures / Treatments   ?Labs ?(all labs ordered are listed, but only abnormal results are displayed) ?Labs Reviewed  ?COMPREHENSIVE METABOLIC PANEL - Abnormal; Notable for the following components:  ?    Result Value  ? Potassium 3.3 (*)   ? Glucose, Bld 212 (*)   ? All other components within normal limits  ?CBC WITH DIFFERENTIAL/PLATELET - Abnormal; Notable for the following components:  ? WBC 13.2 (*)   ? Neutro Abs 10.5 (*)   ? Monocytes Absolute 1.2 (*)   ? Abs Immature Granulocytes 0.08 (*)   ? All other components within normal limits  ?RESP PANEL BY RT-PCR (FLU A&B, COVID) [id number]  ? ? ?EKG ?EKG Interpretation ? ?Date/Time:  Monday March 20 2022 11:17:44 EDT ?Ventricular Rate:  126 ?PR Interval:  89 ?QRS Duration: 114 ?QT Interval:  311 ?QTC Calculation: 451 ?R Axis:   91 ?Text Interpretation: Sinus tachycardia Consider right atrial enlargement Consider right ventricular hypertrophy Probable anteroseptal  infarct, old Borderline T abnormalities, inferior leads Artifact in lead(s) I II III aVR aVL aVF V1 V2 V3 V4 V5 V6 Interpretation limited secondary to artifact Confirmed by Sherwood Gambler [phone]) on 03/20/2022 11:20:14 AM ? ?Radiology ?DG Chest Portable 1 View ? ?Result Date: 03/20/2022 ?CLINICAL DATA:  Cough, COPD EXAM: PORTABLE CHEST 1 VIEW COMPARISON:  03/13/2022 FINDINGS: Cardiac and mediastinal contours are within normal limits. Hyperinflated lungs. No focal pulmonary opacity. No pleural effusion or pneumothorax. No acute osseous abnormality. IMPRESSION: No acute cardiopulmonary process. Electronically Signed   By: Merilyn Baba M.D.   On: 03/20/2022 11:51   ? ?Procedures ?Marland KitchenCritical Care ?Performed by: Sherwood Gambler, MD ?Authorized by: Sherwood Gambler, MD  ? ?Critical care provider statement:  ?  Critical care time (minutes):  30 ?  Critical care time was exclusive of:  Separately billable procedures and treating other patients ?  Critical care was necessary to treat or prevent imminent or life-threatening deterioration of the following conditions:  Respiratory failure ?  Critical care was time spent personally by me on the following activities:  Development of treatment plan with patient or surrogate, discussions with consultants, evaluation of patient's response to treatment, examination of patient, ordering and review of laboratory studies, ordering and review of radiographic studies, ordering and performing treatments and interventions, pulse oximetry, re-evaluation of patient's condition and review of old charts  ? ? ?Medications Ordered in ED ?Medications  ?albuterol (PROVENTIL,VENTOLIN) solution continuous neb (10 mg/hr Nebulization Not Given 03/20/22 1258)  ?lactated ringers bolus 1,000 mL (0 mLs Intravenous Stopped 03/20/22 1245)  ?ipratropium-albuterol (DUONEB) 0.5-2.5 (3) MG/3ML nebulizer solution 3 mL (3 mLs Nebulization Given 03/20/22 1147)  ?albuterol (PROVENTIL) (2.5 MG/3ML) 0.083% nebulizer solution 2.5 mg (2.5 mg Nebulization Given 03/20/22 1147)  ?methylPREDNISolone sodium succinate (SOLU-MEDROL) 125 mg/2 mL injection 125 mg (125 mg Intravenous Given 03/20/22 1146)  ?potassium chloride SA (KLOR-CON M) CR tablet 40 mEq (40 mEq Oral Given 03/20/22 1246)  ?ipratropium (ATROVENT) nebulizer solution 0.5 mg (0.5 mg Nebulization Given 03/20/22 1258)  ?albuterol (PROVENTIL) (2.5 MG/3ML) 0.083% nebulizer solution (10 mg  Given 03/20/22 1258)  ? ? ?ED Course/ Medical Decision Making/ A&P ?  ?                        ?Medical Decision Making ?Amount and/or Complexity of Data Reviewed ?Labs: ordered. ?Radiology: ordered. ? ?Risk ?Prescription drug management. ?Decision regarding hospitalization. ? ? ?Patient was given albuterol and Solu-Medrol along with Atrovent with  some mild improvement.  Still tachycardic and requiring oxygen.  Given a continuous albuterol with improvement in his symptoms but still dipping into the high 80s on room air and will need continuous oxygen supplementation.  Work of breathing is better though not back to baseline.  No pneumonia seen on x-ray on my view/interpretation.  No obvious pneumonia/COVID/flu.  Potassium was repleted with oral potassium.  Will need further supportive care and treatments and I discussed with Dr. Denton Brick for admission. ? ? ? ? ? ? ? ?Final Clinical Impression(s) / ED Diagnoses ?Final diagnoses:  ?COPD exacerbation ([place])  ? ? ?Rx / DC Orders ?ED Discharge Orders   ? ? None  ? ?  ? ? ?  ?Sherwood Gambler, MD ?03/20/22 1554 ? ?

## 2022-03-20 NOTE — H&P (Signed)
?History and Physical  ? ? ?Victor Tran [id number] DOB: Jun 12, 1966 DOA: 03/20/2022 ? ?PCP: Pcp, No  ? ?Patient coming from: Home ? ?I have personally briefly reviewed patient's old medical records in [place] ? ?Chief Complaint: Difficulty breathing ? ?HPI: Victor Tran is a 56 y.o. male with medical history significant for  COPD, ongoing tobacco abuse. ?Patient presented to the ED with complaints of difficulty breathing of 1 week duration.,  With cough productive of greenish sputum.  He is not on home O2. ? ?Patient was recently admitted 3/27 to 3/28 for COPD exacerbation, precipitated by rhinovirus infection.  Treated with DuoNebs, steroids, completed 5-day course of azithromycin.   ?Reports initial improvement in symptoms, but symptoms of subsequently worsened. ? ?No fevers no chills.  No chest pain.  No lower extremity swelling.  He still smokes about half a pack of cigarettes daily. ? ?ED Course: Sats 88% on room air, requiring 2 L , sats now 98 to 98%.  Temperature 98.8.  Heart rate 95-123.  Respiratory rate 16- 30.  Blood pressure systolic [id number] to 0000000.  Chest x-ray without acute abnormality.  COVID and influenza test negative. ?Solu-Medrol 125, DuoNebs and 1 L Ringer's lactate given.  Hospitalist to admit for COPD with hypoxia. ? ?Review of Systems: As per HPI all other systems reviewed and negative. ? ?Past Medical History:  ?Diagnosis Date  ? Allergy   ? Asthma   ? Hypertension   ? Nephrolithiasis   ? Tobacco abuse   ? ? ?Past Surgical History:  ?Procedure Laterality Date  ? EYE SURGERY    ? ? ? reports that he has been smoking cigarettes. He has been smoking an average of .5 packs per day. He has never used smokeless tobacco. He reports current alcohol use of about 21.0 standard drinks per week. He reports that he does not use drugs. ? ?No Known Allergies ? ?Family History  ?Problem Relation Age of Onset  ? Diabetes Mother   ? Hypertension Mother   ? Heart disease Mother   ? Stroke Father   ?  Heart disease Father   ? Diabetes Father   ? HIV/AIDS Brother   ? Cancer Brother 22  ?     Colon Cancer  ? ? ?Prior to Admission medications   ?Medication Sig Start Date End Date Taking? Authorizing Provider  ?albuterol (PROVENTIL) (2.5 MG/3ML) 0.083% nebulizer solution Take 3 mLs (2.5 mg total) by nebulization every 4 (four) hours as needed for wheezing or shortness of breath. 03/14/22  Yes Patrecia Pour, MD  ? ? ?Physical Exam: ?Vitals:  ? 03/20/22 1400 03/20/22 1430 03/20/22 1500 03/20/22 1530  ?BP: 136/85 133/81 130/76 123/73  ?Pulse: 99 99 (!) 102 94  ?Resp: (!) 30 (!) 21 (!) 24 19  ?Temp:      ?TempSrc:      ?SpO2: 98% 98% 91% 90%  ?Weight:      ?Height:      ? ? ?Constitutional: NAD, calm, comfortable ?Vitals:  ? 03/20/22 1400 03/20/22 1430 03/20/22 1500 03/20/22 1530  ?BP: 136/85 133/81 130/76 123/73  ?Pulse: 99 99 (!) 102 94  ?Resp: (!) 30 (!) 21 (!) 24 19  ?Temp:      ?TempSrc:      ?SpO2: 98% 98% 91% 90%  ?Weight:      ?Height:      ? ?Eyes: PERRL, lids and conjunctivae normal ?ENMT: Mucous membranes are moist.  ?Neck: normal, supple, no masses, no thyromegaly ?Respiratory: Diffuse expiratory  rhonchi, increased work of breathing, No accessory muscle use.  ?Cardiovascular: Regular rate and rhythm, no murmurs / rubs / gallops. No extremity edema. 2+ pedal pulses. No carotid bruits.  ?Abdomen: no tenderness, no masses palpated. No hepatosplenomegaly. Bowel sounds positive.  ?Musculoskeletal: no clubbing / cyanosis. No joint deformity upper and lower extremities. Good ROM, no contractures. Normal muscle tone.  ?Skin: no rashes, lesions, ulcers. No induration ?Neurologic: No apparent cranial nerve abnormality moving extremities spontaneously.  ?Psychiatric: Normal judgment and insight. Alert and oriented x 3. Normal mood.  ? ?Labs on Admission: I have personally reviewed following labs and imaging studies ? ?CBC: ?Recent Labs  ?Lab 03/20/22 ?1135  ?WBC 13.2*  ?NEUTROABS 10.5*  ?HGB 14.6  ?HCT 41.5  ?MCV 92.0   ?PLT 377  ? ?Basic Metabolic Panel: ?Recent Labs  ?Lab 03/20/22 ?1135  ?NA 135  ?K 3.3*  ?CL 102  ?CO2 22  ?GLUCOSE 212*  ?BUN 12  ?CREATININE 0.99  ?CALCIUM 8.9  ? ?GFR: ?Estimated Creatinine Clearance: 82.8 mL/min (by C-G formula based on SCr of 0.99 mg/dL). ?Liver Function Tests: ?Recent Labs  ?Lab 03/20/22 ?1135  ?AST 21  ?ALT 18  ?ALKPHOS 46  ?BILITOT 0.6  ?PROT 7.0  ?ALBUMIN 3.5  ? ? ?Radiological Exams on Admission: ?DG Chest Portable 1 View ? ?Result Date: 03/20/2022 ?CLINICAL DATA:  Cough, COPD EXAM: PORTABLE CHEST 1 VIEW COMPARISON:  03/13/2022 FINDINGS: Cardiac and mediastinal contours are within normal limits. Hyperinflated lungs. No focal pulmonary opacity. No pleural effusion or pneumothorax. No acute osseous abnormality. IMPRESSION: No acute cardiopulmonary process. Electronically Signed   By: Merilyn Baba M.D.   On: 03/20/2022 11:51   ? ?EKG: Independently reviewed.  Sinus tachycardia rate 126, artifacts present.  QTc 451.  No significant change from prior. ? ?Assessment/Plan ?Principal Problem: ?  COPD exacerbation ([place]) ?Active Problems: ?  Acute respiratory failure with hypoxia ([place]) ?  Tobacco use ?  ?Assessment and Plan: ?* COPD exacerbation ([place]) ?Dyspnea, productive cough, rhonchi present, chest x-ray clear.  COVID test negative.  Recent hospitalization for COPD exacerbation precipitated by rhinovirus.  With acute respiratory failure today, O2 sat 88% on room air, currently on 2 L sats greater than 90%. ?-DuoNebs as needed and scheduled ?-IV Solu-Medrol 125 mg given, continue 60 twice daily ?-Mucolytics ?-IV ceftriaxone ?- Daily Fasting blood sugars while on steroids ? ?Acute respiratory failure with hypoxia ([place]) ?Secondary to COPD exacerbation.  Currently on 2 L,, sats 88% on room air. ? ?Tobacco use ?Currently smokes half a pack of cigarettes daily. ?-I have counseled him on quitting, states he is ready to quit. ? ? ? ?DVT prophylaxis: Lovenox ?Code Status: Full ?Family Communication:  Spouse at bedside ?Disposition Plan:  ~ 1- 2 days, pending improvement in respiratory status. ?Consults called:  None ?Admission status: Obs tele  ? ? ?Bethena Roys MD ?[facility] ? ?03/20/2022, 5:12 PM  ? ? ?

## 2022-03-20 NOTE — Assessment & Plan Note (Signed)
Secondary to COPD exacerbation.  Currently on 2 L,, sats 88% on room air. ?

## 2022-03-21 DIAGNOSIS — Z823 Family history of stroke: Secondary | ICD-10-CM | POA: Diagnosis not present

## 2022-03-21 DIAGNOSIS — J9601 Acute respiratory failure with hypoxia: Secondary | ICD-10-CM | POA: Diagnosis present

## 2022-03-21 DIAGNOSIS — J441 Chronic obstructive pulmonary disease with (acute) exacerbation: Secondary | ICD-10-CM | POA: Diagnosis present

## 2022-03-21 DIAGNOSIS — Z800 Family history of malignant neoplasm of digestive organs: Secondary | ICD-10-CM | POA: Diagnosis not present

## 2022-03-21 DIAGNOSIS — Z833 Family history of diabetes mellitus: Secondary | ICD-10-CM | POA: Diagnosis not present

## 2022-03-21 DIAGNOSIS — F17210 Nicotine dependence, cigarettes, uncomplicated: Secondary | ICD-10-CM | POA: Diagnosis present

## 2022-03-21 DIAGNOSIS — Z20822 Contact with and (suspected) exposure to covid-19: Secondary | ICD-10-CM | POA: Diagnosis present

## 2022-03-21 DIAGNOSIS — Z8249 Family history of ischemic heart disease and other diseases of the circulatory system: Secondary | ICD-10-CM | POA: Diagnosis not present

## 2022-03-21 DIAGNOSIS — Z79899 Other long term (current) drug therapy: Secondary | ICD-10-CM | POA: Diagnosis not present

## 2022-03-21 LAB — BASIC METABOLIC PANEL
Anion gap: 6 (ref 5–15)
BUN: 9 mg/dL (ref 6–20)
CO2: 25 mmol/L (ref 22–32)
Calcium: 9 mg/dL (ref 8.9–10.3)
Chloride: 107 mmol/L (ref 98–111)
Creatinine, Ser: 0.94 mg/dL (ref 0.61–1.24)
GFR, Estimated: >60 mL/min (ref >60)
Glucose, Bld: 123 mg/dL — ABNORMAL HIGH (ref 70–99)
Potassium: 4.4 mmol/L (ref 3.5–5.1)
Sodium: 138 mmol/L (ref 135–145)

## 2022-03-21 LAB — CBC
HCT: 37.2 % — ABNORMAL LOW (ref 39.0–52.0)
Hemoglobin: 12.6 g/dL — ABNORMAL LOW (ref 13.0–17.0)
MCH: 32.2 pg (ref 26.0–34.0)
MCHC: 33.9 g/dL (ref 30.0–36.0)
MCV: 95.1 fL (ref 80.0–100.0)
Platelets: 354 10*3/uL (ref 150–400)
RBC: 3.91 MIL/uL — ABNORMAL LOW (ref 4.22–5.81)
RDW: 12.8 % (ref 11.5–15.5)
WBC: 14.6 10*3/uL — ABNORMAL HIGH (ref 4.0–10.5)
nRBC: 0 % (ref 0.0–0.2)

## 2022-03-21 LAB — GLUCOSE, CAPILLARY: Glucose-Capillary: 112 mg/dL — ABNORMAL HIGH (ref 70–99)

## 2022-03-21 MED ORDER — DOXYCYCLINE HYCLATE 100 MG PO TABS
100.0000 mg | ORAL_TABLET | Freq: Two times a day (BID) | ORAL | Status: DC
  Administered 2022-03-21 – 2022-03-22 (×3): 100 mg via ORAL
  Filled 2022-03-21 (×3): qty 1

## 2022-03-21 MED ORDER — METHYLPREDNISOLONE SODIUM SUCC 40 MG IJ SOLR
40.0000 mg | Freq: Once | INTRAMUSCULAR | Status: AC
  Administered 2022-03-21: 40 mg via INTRAVENOUS
  Filled 2022-03-21: qty 1

## 2022-03-21 NOTE — Progress Notes (Signed)
?  Transition of Care (TOC) Screening Note ? ? ?Patient Details  ?Name: Victor Tran ?Date of Birth: 11/23/1966 ? ? ?Transition of Care (TOC) CM/SW Contact:    ?Villa Herb, LCSWA ?Phone Number: ?03/21/2022, 10:54 AM ? ? ? ?Transition of Care Department [facility]) has reviewed patient and no TOC needs have been identified at this time. We will continue to monitor patient advancement through interdisciplinary progression rounds. If new patient transition needs arise, please place a TOC consult. ?  ?

## 2022-03-21 NOTE — Progress Notes (Signed)
?PROGRESS NOTE ? ? ? ?Victor Tran  [id number] DOB: [birth year] DOA: 03/20/2022 ?PCP: Pcp, No ? ? ?Brief Narrative:  ?HPI: Victor Tran is a 56 y.o. male with medical history significant for  COPD, ongoing tobacco abuse. ?Patient presented to the ED with complaints of difficulty breathing of 1 week duration.,  With cough productive of greenish sputum.  He is not on home O2. ?  ?Patient was recently admitted 3/27 to 3/28 for COPD exacerbation, precipitated by rhinovirus infection.  Treated with DuoNebs, steroids, completed 5-day course of azithromycin.   ?Reports initial improvement in symptoms, but symptoms of subsequently worsened. ?  ?No fevers no chills.  No chest pain.  No lower extremity swelling.  He still smokes about half a pack of cigarettes daily. ?  ?ED Course: Sats 88% on room air, requiring 2 L , sats now 98 to 98%.  Temperature 98.8.  Heart rate 95-123.  Respiratory rate 16- 30.  Blood pressure systolic [id number] to 0000000.  Chest x-ray without acute abnormality.  COVID and influenza test negative. ?Solu-Medrol 125, DuoNebs and 1 L Ringer's lactate given.  Hospitalist to admit for COPD with hypoxia. ? ?Assessment & Plan: ?  ?Principal Problem: ?  COPD exacerbation ([place]) ?Active Problems: ?  Acute respiratory failure with hypoxia ([place]) ?  Tobacco use ? ?Acute hypoxic respiratory failure secondary to COPD exacerbation [facility]):  Recent hospitalization for COPD exacerbation precipitated by rhinovirus.  Returns with symptoms despite of completing prednisone.  Still symptomatic.  On exam has inspiratory and expiratory bilateral diffuse wheezes.  Still unable to speak in full sentences.  Continue current management.  I will continue IV Solu-Medrol today and start on oral prednisone tomorrow.  Continue bronchodilators.  I will discontinue Rocephin and put him on doxycycline.   ? ?Tobacco use ?Currently smokes half a pack of cigarettes daily. ?-I have counseled him on quitting.  His wife who is at the bedside also  smokes as well. ?  ?DVT prophylaxis: enoxaparin (LOVENOX) injection 40 mg Start: 03/20/22 2000 ?  Code Status: Full Code  ?Family Communication: Patient's wife present at bedside.  Plan of care discussed with patient in length and he/she verbalized understanding and agreed with it. ? ?Status is: Observation ?The patient will require care spanning > 2 midnights and should be moved to inpatient because: Still symptomatic. ? ? ?Estimated body mass index is 20.5 kg/m? as calculated from the following: ?  Height as of this encounter: 5\' 11"  (1.803 m). ?  Weight as of this encounter: 66.7 kg. ? ?  ?Nutritional Assessment: ?Body mass index is 20.5 kg/m?Marland KitchenMarland Kitchen ?Seen by dietician.  I agree with the assessment and plan as outlined below: ?Nutrition Status: ?  ?  ?  ? ?. ?Skin Assessment: ?I have examined the patient's skin and I agree with the wound assessment as performed by the wound care RN as outlined below: ?  ? ?Consultants:  ?None ? ?Procedures:  ?None ? ?Antimicrobials:  ?Anti-infectives (From admission, onward)  ? ? Start     Dose/Rate Route Frequency Ordered Stop  ? 03/20/22 1800  cefTRIAXone (ROCEPHIN) 1 g in sodium chloride 0.9 % 100 mL IVPB       ? 1 g ?200 mL/hr over 30 Minutes Intravenous Every 24 hours 03/20/22 1714 03/25/22 1759  ? ?  ?  ? ? ?Subjective: ?Patient seen and examined.  Wife at the bedside.  Still feels shortness of breath with no improvement compared to yesterday.  No other complaint. ? ?Objective: ?Vitals:  ?  03/21/22 0141 03/21/22 0203 03/21/22 [id number] 03/21/22 0744  ?BP: 138/85  120/81   ?Pulse: 83  72   ?Resp: 16  16   ?Temp: 98.2 ?F (36.8 ?C)  98.2 ?F (36.8 ?C)   ?TempSrc: Oral  Oral   ?SpO2: 97% 95% 95% 93%  ?Weight:      ?Height:      ? ? ?Intake/Output Summary (Last 24 hours) at 03/21/2022 1043 ?Last data filed at 03/21/2022 0900 ?Gross per 24 hour  ?Intake 580 ml  ?Output --  ?Net 580 ml  ? ?Filed Weights  ? 03/20/22 1109 03/20/22 1734  ?Weight: 70.3 kg 66.7 kg  ? ? ?Examination: ? ?General exam:  Appears calm and comfortable  ?Respiratory system: Diffuse inspiratory expiratory bilateral wheezes. Respiratory effort normal. ?Cardiovascular system: S1 & S2 heard, RRR. No JVD, murmurs, rubs, gallops or clicks. No pedal edema. ?Gastrointestinal system: Abdomen is nondistended, soft and nontender. No organomegaly or masses felt. Normal bowel sounds heard. ?Central nervous system: Alert and oriented. No focal neurological deficits. ?Extremities: Symmetric 5 x 5 power. ?Skin: No rashes, lesions or ulcers ?Psychiatry: Judgement and insight appear normal. Mood & affect appropriate.  ? ? ?Data Reviewed: I have personally reviewed following labs and imaging studies ? ?CBC: ?Recent Labs  ?Lab 03/20/22 ?1135 03/21/22 ?0543  ?WBC 13.2* 14.6*  ?NEUTROABS 10.5*  --   ?HGB 14.6 12.6*  ?HCT 41.5 37.2*  ?MCV 92.0 95.1  ?PLT 377 354  ? ?Basic Metabolic Panel: ?Recent Labs  ?Lab 03/20/22 ?1135 03/21/22 ?0543  ?NA 135 138  ?K 3.3* 4.4  ?CL 102 107  ?CO2 22 25  ?GLUCOSE 212* 123*  ?BUN 12 9  ?CREATININE 0.99 0.94  ?CALCIUM 8.9 9.0  ?MG 2.0  --   ? ?GFR: ?Estimated Creatinine Clearance: 82.8 mL/min (by C-G formula based on SCr of 0.94 mg/dL). ?Liver Function Tests: ?Recent Labs  ?Lab 03/20/22 ?1135  ?AST 21  ?ALT 18  ?ALKPHOS 46  ?BILITOT 0.6  ?PROT 7.0  ?ALBUMIN 3.5  ? ?No results for input(s): LIPASE, AMYLASE in the last 168 hours. ?No results for input(s): AMMONIA in the last 168 hours. ?Coagulation Profile: ?No results for input(s): INR, PROTIME in the last 168 hours. ?Cardiac Enzymes: ?No results for input(s): CKTOTAL, CKMB, CKMBINDEX, TROPONINI in the last 168 hours. ?BNP (last 3 results) ?No results for input(s): PROBNP in the last 8760 hours. ?HbA1C: ?No results for input(s): HGBA1C in the last 72 hours. ?CBG: ?No results for input(s): GLUCAP in the last 168 hours. ?Lipid Profile: ?No results for input(s): CHOL, HDL, LDLCALC, TRIG, CHOLHDL, LDLDIRECT in the last 72 hours. ?Thyroid Function Tests: ?No results for input(s):  TSH, T4TOTAL, FREET4, T3FREE, THYROIDAB in the last 72 hours. ?Anemia Panel: ?No results for input(s): VITAMINB12, FOLATE, FERRITIN, TIBC, IRON, RETICCTPCT in the last 72 hours. ?Sepsis Labs: ?No results for input(s): PROCALCITON, LATICACIDVEN in the last 168 hours. ? ?Recent Results (from the past 240 hour(s))  ?Resp Panel by RT-PCR (Flu A&B, Covid) Nasopharyngeal Swab     Status: None  ? Collection Time: 03/13/22  8:12 AM  ? Specimen: Nasopharyngeal Swab; Nasopharyngeal(NP) swabs in vial transport medium  ?Result Value Ref Range Status  ? SARS Coronavirus 2 by RT PCR NEGATIVE NEGATIVE Final  ?  Comment: (NOTE) ?SARS-CoV-2 target nucleic acids are NOT DETECTED. ? ?The SARS-CoV-2 RNA is generally detectable in upper respiratory ?specimens during the acute phase of infection. The lowest ?concentration of SARS-CoV-2 viral copies this assay can detect is ?138 copies/mL. A  negative result does not preclude SARS-Cov-2 ?infection and should not be used as the sole basis for treatment or ?other patient management decisions. A negative result may occur with  ?improper specimen collection/handling, submission of specimen other ?than nasopharyngeal swab, presence of viral mutation(s) within the ?areas targeted by this assay, and inadequate number of viral ?copies(<138 copies/mL). A negative result must be combined with ?clinical observations, patient history, and epidemiological ?information. The expected result is Negative. ? ?Fact Sheet for Patients:  ?EntrepreneurPulse.com.au ? ?Fact Sheet for Healthcare Providers:  ?IncredibleEmployment.be ? ?This test is no t yet approved or cleared by the [place] FDA and  ?has been authorized for detection and/or diagnosis of SARS-CoV-2 by ?FDA under an Emergency Use Authorization (EUA). This EUA will remain  ?in effect (meaning this test can be used) for the duration of the ?COVID-19 declaration under Section 564(b)(1) of the Act,  21 ?U.S.C.section 360bbb-3(b)(1), unless the authorization is terminated  ?or revoked sooner.  ? ? ?  ? Influenza A by PCR NEGATIVE NEGATIVE Final  ? Influenza B by PCR NEGATIVE NEGATIVE Final  ?  Comment: (NOTE) ?The

## 2022-03-22 DIAGNOSIS — J441 Chronic obstructive pulmonary disease with (acute) exacerbation: Secondary | ICD-10-CM | POA: Diagnosis not present

## 2022-03-22 LAB — GLUCOSE, CAPILLARY: Glucose-Capillary: 111 mg/dL — ABNORMAL HIGH (ref 70–99)

## 2022-03-22 MED ORDER — IPRATROPIUM-ALBUTEROL 0.5-2.5 (3) MG/3ML IN SOLN
3.0000 mL | RESPIRATORY_TRACT | 1 refills | Status: DC | PRN
Start: 2022-03-22 — End: 2023-02-07

## 2022-03-22 MED ORDER — METHYLPREDNISOLONE 4 MG PO TBPK: ORAL_TABLET | ORAL | 0 refills | Status: DC

## 2022-03-22 MED ORDER — DOXYCYCLINE HYCLATE 100 MG PO TABS: 100.0000 mg | ORAL_TABLET | Freq: Two times a day (BID) | ORAL | 0 refills | Status: AC

## 2022-03-22 NOTE — Discharge Summary (Signed)
Physician Discharge Summary  ?Victor Tran [id number] DOB: 1966/01/08 DOA: 03/20/2022 ? ?PCP: Pcp, No ? ?Admit date: 03/20/2022 ?Discharge date: 03/22/2022 ? ?Admitted From: Home ?Disposition: Home ? ?Recommendations for Outpatient Follow-up:  ?Follow up with PCP as discussed ? ?Home Health: None ?Equipment/Devices: None ? ?Discharge Condition: Stable ?CODE STATUS: Full ?Diet recommendation: Low-salt low-fat diet ? ?Brief/Interim Summary: ?Victor Tran is a 56 y.o. male with medical history significant for  COPD, ongoing tobacco abuse. Patient presented to the ED with complaints of difficulty breathing of 1 week duration.,  With cough productive of greenish sputum.  He is not on home O2. ?  ?Patient was recently admitted 3/27 to 3/28 for COPD exacerbation, precipitated by rhinovirus infection.  Treated with DuoNebs, steroids, completed 5-day course of azithromycin.   ? ?Patient again improved drastically on steroids, nebs, broad-spectrum antibiotics.  Patient now back to baseline able to ambulate without hypoxia or further symptoms stable and agreeable for discharge.  Lengthy discussion at bedside given need for ongoing outpatient evaluation recommending patient follow-up with new PCP in the next 1 to 2 weeks.  He may also benefit from further evaluation with pulmonology given his baseline respiratory status with ongoing tobacco abuse.  We discussed that continuation of tobacco would likely only lead to worsening respiratory status.  Patient otherwise stable and agreeable for discharge home ? ?Discharge Diagnoses:  ?Principal Problem: ?  COPD exacerbation ([place]) ?Active Problems: ?  Acute respiratory failure with hypoxia ([place]) ?  Tobacco use ?  COPD with acute exacerbation ([place]) ? ? ? ?Discharge Instructions ? ?Discharge Instructions   ? ? Call MD for:  difficulty breathing, headache or visual disturbances   Complete by: As directed ?  ? Call MD for:  extreme fatigue   Complete by: As directed ?  ? Call MD for:   persistant dizziness or light-headedness   Complete by: As directed ?  ? Diet - low sodium heart healthy   Complete by: As directed ?  ? Increase activity slowly   Complete by: As directed ?  ? ?  ? ?Allergies as of 03/22/2022   ?No Known Allergies ?  ? ?  ?Medication List  ?  ? ?STOP taking these medications   ? ?albuterol (2.5 MG/3ML) 0.083% nebulizer solution ?Commonly known as: PROVENTIL ?  ? ?  ? ?TAKE these medications   ? ?doxycycline 100 MG tablet ?Commonly known as: VIBRA-TABS ?Take 1 tablet (100 mg total) by mouth every 12 (twelve) hours for 3 days. ?  ?ipratropium-albuterol 0.5-2.5 (3) MG/3ML Soln ?Commonly known as: DUONEB ?Take 3 mLs by nebulization every 4 (four) hours as needed. ?  ?methylPREDNISolone 4 MG Tbpk tablet ?Commonly known as: MEDROL DOSEPAK ?Taper as directed on packaging ?  ? ?  ? ? ?No Known Allergies ? ?Consultations: ?None ? ?Procedures/Studies: ?DG Chest Portable 1 View ? ?Result Date: 03/20/2022 ?CLINICAL DATA:  Cough, COPD EXAM: PORTABLE CHEST 1 VIEW COMPARISON:  03/13/2022 FINDINGS: Cardiac and mediastinal contours are within normal limits. Hyperinflated lungs. No focal pulmonary opacity. No pleural effusion or pneumothorax. No acute osseous abnormality. IMPRESSION: No acute cardiopulmonary process. Electronically Signed   By: Merilyn Baba M.D.   On: 03/20/2022 11:51  ? ?DG Chest Port 1 View ? ?Result Date: 03/13/2022 ?CLINICAL DATA:  Shortness of breath EXAM: PORTABLE CHEST 1 VIEW COMPARISON:  Chest radiograph dated March 07, 2021 FINDINGS: The heart size and mediastinal contours are within normal limits. Hyperinflated lungs. No focal consolidation or pleural effusion. The visualized skeletal structures  are unremarkable. IMPRESSION: Hyperinflated lungs without evidence of acute cardiopulmonary process. Electronically Signed   By: Keane Police D.O.   On: 03/13/2022 08:45   ? ? ?Subjective: No acute issues or events overnight, denies nausea vomiting diarrhea constipation headache fevers  chills or chest pain ? ? ?Discharge Exam: ?Vitals:  ? 03/21/22 2102 03/22/22 [id number]  ?BP: (!) 163/89 136/87  ?Pulse: 93 77  ?Resp: 18 18  ?Temp: 98.7 ?F (37.1 ?C) 97.7 ?F (36.5 ?C)  ?SpO2: 94% 97%  ? ?Vitals:  ? 03/21/22 1420 03/21/22 2021 03/21/22 2102 03/22/22 0649  ?BP:   (!) 163/89 136/87  ?Pulse:   93 77  ?Resp:   18 18  ?Temp:   98.7 ?F (37.1 ?C) 97.7 ?F (36.5 ?C)  ?TempSrc:   Oral Oral  ?SpO2: 93% 93% 94% 97%  ?Weight:      ?Height:      ? ? ?General: Pt is alert, awake, not in acute distress ?Cardiovascular: RRR, S1/S2 +, no rubs, no gallops ?Respiratory: CTA bilaterally, no wheezing, no rhonchi ?Abdominal: Soft, NT, ND, bowel sounds + ?Extremities: no edema, no cyanosis ? ? ? ?The results of significant diagnostics from this hospitalization (including imaging, microbiology, ancillary and laboratory) are listed below for reference.   ? ? ?Microbiology: ?Recent Results (from the past 240 hour(s))  ?Resp Panel by RT-PCR (Flu A&B, Covid) Nasopharyngeal Swab     Status: None  ? Collection Time: 03/13/22  8:12 AM  ? Specimen: Nasopharyngeal Swab; Nasopharyngeal(NP) swabs in vial transport medium  ?Result Value Ref Range Status  ? SARS Coronavirus 2 by RT PCR NEGATIVE NEGATIVE Final  ?  Comment: (NOTE) ?SARS-CoV-2 target nucleic acids are NOT DETECTED. ? ?The SARS-CoV-2 RNA is generally detectable in upper respiratory ?specimens during the acute phase of infection. The lowest ?concentration of SARS-CoV-2 viral copies this assay can detect is ?138 copies/mL. A negative result does not preclude SARS-Cov-2 ?infection and should not be used as the sole basis for treatment or ?other patient management decisions. A negative result may occur with  ?improper specimen collection/handling, submission of specimen other ?than nasopharyngeal swab, presence of viral mutation(s) within the ?areas targeted by this assay, and inadequate number of viral ?copies(<138 copies/mL). A negative result must be combined with ?clinical  observations, patient history, and epidemiological ?information. The expected result is Negative. ? ?Fact Sheet for Patients:  ?EntrepreneurPulse.com.au ? ?Fact Sheet for Healthcare Providers:  ?IncredibleEmployment.be ? ?This test is no t yet approved or cleared by the [place] FDA and  ?has been authorized for detection and/or diagnosis of SARS-CoV-2 by ?FDA under an Emergency Use Authorization (EUA). This EUA will remain  ?in effect (meaning this test can be used) for the duration of the ?COVID-19 declaration under Section 564(b)(1) of the Act, 21 ?U.S.C.section 360bbb-3(b)(1), unless the authorization is terminated  ?or revoked sooner.  ? ? ?  ? Influenza A by PCR NEGATIVE NEGATIVE Final  ? Influenza B by PCR NEGATIVE NEGATIVE Final  ?  Comment: (NOTE) ?The Xpert Xpress SARS-CoV-2/FLU/RSV plus assay is intended as an aid ?in the diagnosis of influenza from Nasopharyngeal swab specimens and ?should not be used as a sole basis for treatment. Nasal washings and ?aspirates are unacceptable for Xpert Xpress SARS-CoV-2/FLU/RSV ?testing. ? ?Fact Sheet for Patients: ?EntrepreneurPulse.com.au ? ?Fact Sheet for Healthcare Providers: ?IncredibleEmployment.be ? ?This test is not yet approved or cleared by the [place] FDA and ?has been authorized for detection and/or diagnosis of SARS-CoV-2 by ?FDA under an Emergency Use  Authorization (EUA). This EUA will remain ?in effect (meaning this test can be used) for the duration of the ?COVID-19 declaration under Section 564(b)(1) of the Act, 21 U.S.C. ?section 360bbb-3(b)(1), unless the authorization is terminated or ?revoked. ? ?Performed at [facility], [address] [place], [place] [address] ?  ?Respiratory (~20 pathogens) panel by PCR     Status: Abnormal  ? Collection Time: 03/13/22  8:12 AM  ? Specimen: Nasopharyngeal Swab; Respiratory  ?Result Value Ref Range Status  ? Adenovirus NOT DETECTED  NOT DETECTED Final  ? Coronavirus 229E NOT DETECTED NOT DETECTED Final  ?  Comment: (NOTE) ?The Coronavirus on the Respiratory Panel, DOES NOT test for the novel  ?Coronavirus (2019 nCoV) ?  ? Coronavirus HKU1

## 2022-03-22 NOTE — Plan of Care (Signed)

## 2022-03-22 NOTE — Plan of Care (Signed)
?  Problem: Coping: ?Goal: Level of anxiety will decrease ?03/22/2022 0909 by Melony Overly, RN ?Outcome: Adequate for Discharge ?03/22/2022 0907 by Melony Overly, RN ?Outcome: Progressing ?  ?Problem: Elimination: ?Goal: Will not experience complications related to bowel motility ?03/22/2022 0909 by Melony Overly, RN ?Outcome: Adequate for Discharge ?03/22/2022 0907 by Melony Overly, RN ?Outcome: Progressing ?Goal: Will not experience complications related to urinary retention ?03/22/2022 0909 by Melony Overly, RN ?Outcome: Adequate for Discharge ?03/22/2022 0907 by Melony Overly, RN ?Outcome: Progressing ?  ?Problem: Pain Managment: ?Goal: General experience of comfort will improve ?03/22/2022 0909 by Melony Overly, RN ?Outcome: Adequate for Discharge ?03/22/2022 0907 by Melony Overly, RN ?Outcome: Progressing ?  ?Problem: Safety: ?Goal: Ability to remain free from injury will improve ?03/22/2022 0909 by Melony Overly, RN ?Outcome: Adequate for Discharge ?03/22/2022 0907 by Melony Overly, RN ?Outcome: Progressing ?  ?Problem: Skin Integrity: ?Goal: Risk for impaired skin integrity will decrease ?03/22/2022 0909 by Melony Overly, RN ?Outcome: Adequate for Discharge ?03/22/2022 0907 by Melony Overly, RN ?Outcome: Progressing ?  ?

## 2022-03-22 NOTE — Care Management Important Message (Signed)
Important Message ? ?Patient Details  ?Name: Victor Tran ?MRN: [id number] ?Date of Birth: 1966-09-21 ? ? ?Medicare Important Message Given:  N/A - LOS <3 / Initial given by admissions ? ? ? ? ?[name] ?03/22/2022, 11:53 AM ?

## 2022-04-14 ENCOUNTER — Ambulatory Visit: Payer: Self-pay | Admitting: Family Medicine

## 2022-04-14 VITALS — BP 116/84 | HR 83 | Temp 98.1°F | Ht 71.0 in | Wt 147.0 lb

## 2022-04-14 DIAGNOSIS — Z13220 Encounter for screening for lipoid disorders: Secondary | ICD-10-CM

## 2022-04-14 DIAGNOSIS — R739 Hyperglycemia, unspecified: Secondary | ICD-10-CM | POA: Diagnosis not present

## 2022-04-14 DIAGNOSIS — J449 Chronic obstructive pulmonary disease, unspecified: Secondary | ICD-10-CM

## 2022-04-14 DIAGNOSIS — Z125 Encounter for screening for malignant neoplasm of prostate: Secondary | ICD-10-CM

## 2022-04-14 DIAGNOSIS — D649 Anemia, unspecified: Secondary | ICD-10-CM | POA: Diagnosis not present

## 2022-04-14 MED ORDER — ALBUTEROL SULFATE HFA 108 (90 BASE) MCG/ACT IN AERS: 2.0000 | INHALATION_SPRAY | Freq: Four times a day (QID) | RESPIRATORY_TRACT | 6 refills | Status: DC | PRN

## 2022-04-14 NOTE — Patient Instructions (Signed)
Labs ordered.  Follow up in 6 months.  Take care  Dr. Mikylah Ackroyd  

## 2022-04-17 DIAGNOSIS — J449 Chronic obstructive pulmonary disease, unspecified: Secondary | ICD-10-CM | POA: Insufficient documentation

## 2022-04-17 NOTE — Assessment & Plan Note (Signed)
Stable currently.  DuoNebs as needed.  Albuterol inhaler if needed as well. ?

## 2022-04-17 NOTE — Progress Notes (Signed)
? ?Subjective:  ?Patient ID: Victor Tran, male    DOB: 04-06-1966  Age: 56 y.o. MRN: [id number] ? ?CC: ?Chief Complaint  ?Patient presents with  ? Establish Care  ? Form Completion  ?  To go back to work - recent hospitalization for copd - needs forms completed  ? ? ?HPI: ? ?56 year old male with recent hospital admissions for COPD exacerbations presents to establish care. ? ?Patient has quit smoking.  Patient was congratulated today.  Patient doing well from COPD standpoint at this time.  He is using DuoNebs at home as needed.  He would like a prescription for albuterol inhaler. ? ?Patient is eager to return to work.  He states that he needs form filled out so that he may return to work.  Essentially, he has to be cleared to return to work by a primary care physician.  No shortness of breath or issues at this time. ? ?Patient Active Problem List  ? Diagnosis Date Noted  ? COPD (chronic obstructive pulmonary disease) ([place]) 04/17/2022  ? Healthcare maintenance 09/23/2018  ? ? ?Social Hx   ?Social History  ? ?Socioeconomic History  ? Marital status: Married  ?  Spouse name: n/a  ? Number of children: 0  ? Years of education: 53  ? Highest education level: Not on file  ?Occupational History  ? Occupation: Copywriter, advertising  ?Tobacco Use  ? Smoking status: Every Day  ?  Packs/day: 0.50  ?  Types: Cigarettes  ? Smokeless tobacco: Never  ? Tobacco comments:  ?  down to two cigarettes a day  ?Vaping Use  ? Vaping Use: Never used  ?Substance and Sexual Activity  ? Alcohol use: Yes  ?  Alcohol/week: 21.0 standard drinks  ?  Types: 21 Cans of beer per week  ?  Comment: 2-3 cans of beer daily  ? Drug use: No  ? Sexual activity: Yes  ?  Birth control/protection: Condom  ?  Comment: married   ?Other Topics Concern  ? Not on file  ?Social History Narrative  ? Works full time at [employer]  ? Married- first wife died tragically (he found her in bed, passed away)   ? Had 2 dogs 3 cats  ? Loves to fish   ? ?Social Determinants of  Health  ? ?Financial Resource Strain: Not on file  ?Food Insecurity: Not on file  ?Transportation Needs: Not on file  ?Physical Activity: Not on file  ?Stress: Not on file  ?Social Connections: Not on file  ? ? ?Review of Systems  ?Constitutional: Negative.   ?Respiratory: Negative.    ? ? ?Objective:  ?BP 116/84   Pulse 83   Temp 98.1 ?F (36.7 ?C)   Ht 5' 11"  (1.803 m)   Wt 147 lb (66.7 kg)   SpO2 98%   BMI 20.50 kg/m?  ? ? ?  04/14/2022  ? 10:43 AM 03/22/2022  ?  6:49 AM 03/21/2022  ?  9:02 PM  ?BP/Weight  ?Systolic BP 761 607 371  ?Diastolic BP 84 87 89  ?Wt. (Lbs) 147    ?BMI 20.5 kg/m2    ? ? ?Physical Exam ?Vitals and nursing note reviewed.  ?Constitutional:   ?   General: He is not in acute distress. ?   Appearance: Normal appearance. He is not ill-appearing.  ?HENT:  ?   Head: Normocephalic and atraumatic.  ?Eyes:  ?   General:     ?   Right eye: No discharge.     ?  Left eye: No discharge.  ?   Conjunctiva/sclera: Conjunctivae normal.  ?Cardiovascular:  ?   Rate and Rhythm: Normal rate and regular rhythm.  ?Pulmonary:  ?   Effort: Pulmonary effort is normal.  ?   Breath sounds: Normal breath sounds. No wheezing, rhonchi or rales.  ?Neurological:  ?   Mental Status: He is alert.  ?Psychiatric:     ?   Mood and Affect: Mood normal.     ?   Behavior: Behavior normal.  ? ? ?Lab Results  ?Component Value Date  ? WBC 14.6 (H) 03/21/2022  ? HGB 12.6 (L) 03/21/2022  ? HCT 37.2 (L) 03/21/2022  ? PLT 354 03/21/2022  ? GLUCOSE 123 (H) 03/21/2022  ? CHOL 160 09/23/2018  ? TRIG 123 09/23/2018  ? HDL 64 09/23/2018  ? [place] 71 09/23/2018  ? ALT 18 03/20/2022  ? AST 21 03/20/2022  ? NA 138 03/21/2022  ? K 4.4 03/21/2022  ? CL 107 03/21/2022  ? CREATININE 0.94 03/21/2022  ? BUN 9 03/21/2022  ? CO2 25 03/21/2022  ? HGBA1C 6.1 (H) 12/22/2012  ? ? ? ?Assessment & Plan:  ? ?Problem List Items Addressed This Visit   ? ?  ? Respiratory  ? COPD (chronic obstructive pulmonary disease) ([place]) - Primary  ?  Stable currently.   DuoNebs as needed.  Albuterol inhaler if needed as well. ? ?  ?  ? Relevant Medications  ? albuterol (VENTOLIN HFA) 108 (90 Base) MCG/ACT inhaler  ? ?Other Visit Diagnoses   ? ? Anemia, unspecified type      ? Relevant Orders  ? CBC  ? Blood glucose elevated      ? Relevant Orders  ? CMP14+EGFR  ? Hemoglobin A1c  ? Screening PSA (prostate specific antigen)      ? Relevant Orders  ? PSA  ? Screening, lipid      ? Relevant Orders  ? Lipid panel  ? Prostate cancer screening      ? ?  ? ? ?Meds ordered this encounter  ?Medications  ? albuterol (VENTOLIN HFA) 108 (90 Base) MCG/ACT inhaler  ?  Sig: Inhale 2 puffs into the lungs every 6 (six) hours as needed for wheezing or shortness of breath.  ?  Dispense:  18 g  ?  Refill:  6  ? ? ?Follow-up:  Return in about 6 months (around 10/14/2022). ? ?Thersa Salt DO ?[employer] ? ?

## 2022-09-12 ENCOUNTER — Telehealth: Payer: Self-pay | Admitting: Family Medicine

## 2022-09-12 NOTE — Telephone Encounter (Signed)
  Left message for patient to call back and schedule Medicare Annual Wellness Visit (AWV)  Please offer to do virtually or by telephone.  No hx of AWV eligible for AWVI per [facility] as of 03/18/2022  Please schedule at anytime with [facility]-Nurse Health Advisor.      45 minute appointment   Any questions, please call me at [phone]

## 2022-11-06 ENCOUNTER — Emergency Department (HOSPITAL_COMMUNITY): Payer: 59

## 2022-11-06 ENCOUNTER — Encounter (HOSPITAL_COMMUNITY): Payer: Self-pay | Admitting: Emergency Medicine

## 2022-11-06 ENCOUNTER — Other Ambulatory Visit: Payer: Self-pay

## 2022-11-06 ENCOUNTER — Emergency Department (HOSPITAL_COMMUNITY)
Admission: EM | Admit: 2022-11-06 | Discharge: 2022-11-06 | Disposition: A | Discharge status: Home / Self Care | Payer: 59 | Attending: Emergency Medicine | Admitting: Emergency Medicine

## 2022-11-06 DIAGNOSIS — R110 Nausea: Secondary | ICD-10-CM | POA: Diagnosis not present

## 2022-11-06 DIAGNOSIS — I10 Essential (primary) hypertension: Secondary | ICD-10-CM | POA: Insufficient documentation

## 2022-11-06 DIAGNOSIS — F17200 Nicotine dependence, unspecified, uncomplicated: Secondary | ICD-10-CM | POA: Diagnosis not present

## 2022-11-06 DIAGNOSIS — J449 Chronic obstructive pulmonary disease, unspecified: Secondary | ICD-10-CM | POA: Diagnosis not present

## 2022-11-06 DIAGNOSIS — R42 Dizziness and giddiness: Secondary | ICD-10-CM | POA: Insufficient documentation

## 2022-11-06 DIAGNOSIS — R519 Headache, unspecified: Secondary | ICD-10-CM | POA: Diagnosis present

## 2022-11-06 LAB — CBC WITH DIFFERENTIAL/PLATELET
Abs Immature Granulocytes: 0.01 10*3/uL (ref 0.00–0.07)
Basophils Absolute: 0.1 10*3/uL (ref 0.0–0.1)
Basophils Relative: 1 %
Eosinophils Absolute: 0.1 10*3/uL (ref 0.0–0.5)
Eosinophils Relative: 2 %
HCT: 39.3 % (ref 39.0–52.0)
Hemoglobin: 13.6 g/dL (ref 13.0–17.0)
Immature Granulocytes: 0 %
Lymphocytes Relative: 24 %
Lymphs Abs: 1.5 10*3/uL (ref 0.7–4.0)
MCH: 32.9 pg (ref 26.0–34.0)
MCHC: 34.6 g/dL (ref 30.0–36.0)
MCV: 94.9 fL (ref 80.0–100.0)
Monocytes Absolute: 0.6 10*3/uL (ref 0.1–1.0)
Monocytes Relative: 10 %
Neutro Abs: 3.8 10*3/uL (ref 1.7–7.7)
Neutrophils Relative %: 63 %
Platelets: 301 10*3/uL (ref 150–400)
RBC: 4.14 MIL/uL — ABNORMAL LOW (ref 4.22–5.81)
RDW: 11.9 % (ref 11.5–15.5)
WBC: 6.1 10*3/uL (ref 4.0–10.5)
nRBC: 0 % (ref 0.0–0.2)

## 2022-11-06 LAB — COMPREHENSIVE METABOLIC PANEL
ALT: 12 U/L (ref 0–44)
AST: 17 U/L (ref 15–41)
Albumin: 3.4 g/dL — ABNORMAL LOW (ref 3.5–5.0)
Alkaline Phosphatase: 45 U/L (ref 38–126)
Anion gap: 3 — ABNORMAL LOW (ref 5–15)
BUN: 10 mg/dL (ref 6–20)
CO2: 27 mmol/L (ref 22–32)
Calcium: 8.9 mg/dL (ref 8.9–10.3)
Chloride: 106 mmol/L (ref 98–111)
Creatinine, Ser: 0.96 mg/dL (ref 0.61–1.24)
GFR, Estimated: >60 mL/min (ref >60)
Glucose, Bld: 88 mg/dL (ref 70–99)
Potassium: 4.4 mmol/L (ref 3.5–5.1)
Sodium: 136 mmol/L (ref 135–145)
Total Bilirubin: 0.6 mg/dL (ref 0.3–1.2)
Total Protein: 6.3 g/dL — ABNORMAL LOW (ref 6.5–8.1)

## 2022-11-06 LAB — TROPONIN I (HIGH SENSITIVITY)
Troponin I (High Sensitivity): 4 ng/L (ref <18)
Troponin I (High Sensitivity): 4 ng/L (ref <18)

## 2022-11-06 LAB — MAGNESIUM: Magnesium: 2 mg/dL (ref 1.7–2.4)

## 2022-11-06 LAB — CBG MONITORING, ED: Glucose-Capillary: 88 mg/dL (ref 70–99)

## 2022-11-06 MED ORDER — LACTATED RINGERS IV BOLUS
1000.0000 mL | Freq: Once | INTRAVENOUS | Status: AC
Start: 2022-11-06 — End: 2022-11-06
  Administered 2022-11-06: 1000 mL via INTRAVENOUS

## 2022-11-06 MED ORDER — ACETAMINOPHEN 325 MG PO TABS
650.0000 mg | ORAL_TABLET | Freq: Once | ORAL | Status: AC
  Administered 2022-11-06: 650 mg via ORAL
  Filled 2022-11-06: qty 2

## 2022-11-06 MED ORDER — MECLIZINE HCL 25 MG PO TABS: 25.0000 mg | ORAL_TABLET | Freq: Three times a day (TID) | ORAL | 0 refills | Status: DC | PRN

## 2022-11-06 NOTE — ED Triage Notes (Signed)
Pt bib EMS after he c/o headache and dizziness. EMS states pt was hypertensive upon their arrival at 168/92. CBG was 102.

## 2022-11-06 NOTE — Discharge Instructions (Addendum)
A prescription for meclizine was sent to your pharmacy.  This is a medication to treat dizziness and nausea.  Take this only as needed.  Follow-up with your primary care doctor and return to the emergency department for any new or worsening symptoms of concern.

## 2022-11-06 NOTE — ED Provider Notes (Signed)
[facility] EMERGENCY DEPARTMENT Provider Note   CSN: [id number] Arrival date & time: 11/06/22  9767     History  Chief Complaint  Patient presents with   Headache and Dizziness    Victor Tran is a 56 y.o. male.  HPI Patient presents for dizziness and nausea.  Medical history includes COPD, HTN, tobacco use.  Patient reports that he was in his normal state of health upon awakening this morning.  He went to work at approximately 4:15 AM.  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  While there, at approximately 515, he experienced a mild headache, dizziness, and nausea.  Symptoms persisted for approximately 45 minutes.  When EMS arrived on scene, he was found to have moderately elevated blood pressure.  CBG was normal.  Patient does not take any home medications.  He does drink 2-3 beers per day.  Last drink was last night.  Currently, he endorses a mild occipital headache.  He denies any other current symptoms.    Home Medications Prior to Admission medications   Medication Sig Start Date End Date Taking? Authorizing Provider  meclizine (ANTIVERT) 25 MG tablet Take 1 tablet (25 mg total) by mouth 3 (three) times daily as needed for dizziness. 11/06/22  Yes Gloris Manchester, MD  albuterol (VENTOLIN HFA) 108 (90 Base) MCG/ACT inhaler Inhale 2 puffs into the lungs every 6 (six) hours as needed for wheezing or shortness of breath. 04/14/22   Tommie Sams, DO  ipratropium-albuterol (DUONEB) 0.5-2.5 (3) MG/3ML SOLN Take 3 mLs by nebulization every 4 (four) hours as needed. 03/22/22 05/21/22  Azucena Fallen, MD      Allergies    Patient has no known allergies.    Review of Systems   Review of Systems  Gastrointestinal:  Positive for nausea.  Neurological:  Positive for dizziness, light-headedness and headaches.  All other systems reviewed and are negative.   Physical Exam Updated Vital Signs BP (!) 141/92   Pulse 73   Temp 98.2 F (36.8 C) (Tympanic)    Resp 18   Ht 5\' 11"  (1.803 m)   Wt 69.9 kg   SpO2 98%   BMI 21.48 kg/m  Physical Exam Vitals and nursing note reviewed.  Constitutional:      General: He is not in acute distress.    Appearance: Normal appearance. He is well-developed. He is not ill-appearing, toxic-appearing or diaphoretic.  HENT:     Head: Normocephalic and atraumatic.     Right Ear: External ear normal.     Left Ear: External ear normal.     Nose: Nose normal.     Mouth/Throat:     Mouth: Mucous membranes are moist.     Pharynx: Oropharynx is clear.  Eyes:     Extraocular Movements: Extraocular movements intact.     Conjunctiva/sclera: Conjunctivae normal.     Comments: No nystagmus  Cardiovascular:     Rate and Rhythm: Normal rate and regular rhythm.  Pulmonary:     Effort: Pulmonary effort is normal. No respiratory distress.  Abdominal:     General: There is no distension.     Palpations: Abdomen is soft.  Musculoskeletal:        General: No swelling. Normal range of motion.     Cervical back: Normal range of motion and neck supple. No rigidity or tenderness.     Right lower leg: No edema.     Left lower leg: No edema.  Skin:  General: Skin is warm and dry.     Capillary Refill: Capillary refill takes less than 2 seconds.     Coloration: Skin is not jaundiced or pale.  Neurological:     General: No focal deficit present.     Mental Status: He is alert and oriented to person, place, and time.     Cranial Nerves: No cranial nerve deficit.     Sensory: No sensory deficit.     Motor: No weakness.     Coordination: Coordination normal.  Psychiatric:        Mood and Affect: Mood normal.        Behavior: Behavior normal.        Thought Content: Thought content normal.        Judgment: Judgment normal.     ED Results / Procedures / Treatments   Labs (all labs ordered are listed, but only abnormal results are displayed) Labs Reviewed  COMPREHENSIVE METABOLIC PANEL - Abnormal; Notable for the  following components:      Result Value   Total Protein 6.3 (*)    Albumin 3.4 (*)    Anion gap 3 (*)    All other components within normal limits  CBC WITH DIFFERENTIAL/PLATELET - Abnormal; Notable for the following components:   RBC 4.14 (*)    All other components within normal limits  MAGNESIUM  CBG MONITORING, ED  TROPONIN I (HIGH SENSITIVITY)  TROPONIN I (HIGH SENSITIVITY)    EKG EKG Interpretation  Date/Time:  Monday November 06 2022 06:40:53 EST Ventricular Rate:  75 PR Interval:  178 QRS Duration: 87 QT Interval:  405 QTC Calculation: 453 R Axis:   73 Text Interpretation: Sinus rhythm RSR' in V1 or V2, probably normal variant Minimal ST elevation, inferior leads Confirmed by Godfrey Pick ([id number]) on 11/06/2022 8:34:55 AM  Radiology CT Head Wo Contrast  Result Date: 11/06/2022 CLINICAL DATA:  Headache and dizziness EXAM: CT HEAD WITHOUT CONTRAST TECHNIQUE: Contiguous axial images were obtained from the base of the skull through the vertex without intravenous contrast. RADIATION DOSE REDUCTION: This exam was performed according to the departmental dose-optimization program which includes automated exposure control, adjustment of the mA and/or kV according to patient size and/or use of iterative reconstruction technique. COMPARISON:  None Available. FINDINGS: Image quality is degraded by motion artifact. Brain: There is no acute intracranial hemorrhage, extra-axial fluid collection, or acute infarct. Parenchymal volume is normal. The ventricles are normal in size. Gray-white differentiation is preserved There is no mass lesion.  There is no mass effect or midline shift. Vascular: No hyperdense vessel or unexpected calcification. Skull: Normal. Negative for fracture or focal lesion. Sinuses/Orbits: There is marked mucosal thickening in the maxillary sinuses, incompletely imaged. The globes and orbits are unremarkable. Other: The mastoid air cells and middle ear cavities are clear.  IMPRESSION: Motion degraded study. Within this confine, no acute intracranial pathology. Electronically Signed   By: Valetta Mole M.D.   On: 11/06/2022 11:29   DG Chest Portable 1 View  Result Date: 11/06/2022 CLINICAL DATA:  Headache and dizziness. EXAM: PORTABLE CHEST 1 VIEW COMPARISON:  03/20/2022 FINDINGS: Heart size and mediastinal contours are unremarkable. No pleural effusion or edema identified. No airspace opacities identified. The visualized osseous structures are unremarkable. IMPRESSION: No active disease. Electronically Signed   By: Kerby Moors M.D.   On: 11/06/2022 07:51    Procedures Procedures    Medications Ordered in ED Medications  lactated ringers bolus 1,000 mL (0 mLs Intravenous Stopped 11/06/22  0910)  acetaminophen (TYLENOL) tablet 650 mg (650 mg Oral Given 11/06/22 [id number])    ED Course/ Medical Decision Making/ A&P                           Medical Decision Making Amount and/or Complexity of Data Reviewed Labs: ordered. Radiology: ordered.  Risk OTC drugs.   This patient presents to the ED for concern of dizziness, this involves an extensive number of treatment options, and is a complaint that carries with it a high risk of complications and morbidity.  The differential diagnosis includes dehydration, metabolic derangements, arrhythmia, anxiety, panic attack, TIA, BPPV, Mnire's disease, vestibular neuritis   Co morbidities that complicate the patient evaluation  COPD, HTN, tobacco use   Additional history obtained:  Additional history obtained from patient's wife External records from outside source obtained and reviewed including EMR   Lab Tests:  I Ordered, and personally interpreted labs.  The pertinent results include: Normal hemoglobin, no leukocytosis, normal kidney function, normal electrolytes, normal troponin   Imaging Studies ordered:  I ordered imaging studies including chest x-ray, CT head I independently visualized and  interpreted imaging which showed no acute findings I agree with the radiologist interpretation   Cardiac Monitoring: / EKG:  The patient was maintained on a cardiac monitor.  I personally viewed and interpreted the cardiac monitored which showed an underlying rhythm of: Sinus rhythm  Problem List / ED Course / Critical interventions / Medication management  Patient presents for a transient episode of what is described as mild headache, dizziness, lightheadedness, and nausea.  This occurred this morning approximately 2 hours prior to arrival.  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  He states that he was in his normal state of health prior to this episode.  EMS noted hypertension and normoglycemia prior to arrival.  On exam, patient is well-appearing.  He endorses mild occipital headache.  He has no focal neurologic deficits.  He was placed on bedside cardiac monitor and diagnostic work-up was initiated.  Work-up is reassuring with unremarkable labs and no acute findings on chest x-ray or CT head.  Patient remained asymptomatic while in the ED.  He was able to ambulate without any recurrence of symptoms.  He was prescribed as needed meclizine for home and advised to follow-up with primary care doctor.  He is stable for discharge at this time. I ordered medication including IVF for hydration; Tylenol for headache Reevaluation of the patient after these medicines showed that the patient resolved I have reviewed the patients home medicines and have made adjustments as needed   Social Determinants of Health:  Has PCP         Final Clinical Impression(s) / ED Diagnoses Final diagnoses:  Dizziness    Rx / DC Orders ED Discharge Orders          Ordered    meclizine (ANTIVERT) 25 MG tablet  3 times daily PRN,   Status:  Discontinued        11/06/22 1259    meclizine (ANTIVERT) 25 MG tablet  3 times daily PRN        11/06/22 1300              Godfrey Pick, MD 11/06/22  1301

## 2022-11-07 ENCOUNTER — Ambulatory Visit (INDEPENDENT_AMBULATORY_CARE_PROVIDER_SITE_OTHER): Payer: Medicare Other | Admitting: Family Medicine

## 2022-11-07 VITALS — BP 138/87 | HR 97 | Temp 98.6°F | Ht 71.0 in | Wt 149.6 lb

## 2022-11-07 DIAGNOSIS — R519 Headache, unspecified: Secondary | ICD-10-CM | POA: Diagnosis not present

## 2022-11-07 NOTE — Assessment & Plan Note (Signed)
Patient presented with headache and associated nausea and dizziness.  He is currently asymptomatic.  There has been concern for hypertension.  Blood pressure 138/87 today without the use of pharmacotherapy.  Advised close monitoring at home.  Supportive care.  May return to work.

## 2022-11-07 NOTE — Patient Instructions (Signed)
Keep an eye on your BP.  Follow up in 3 months.  Take care  Dr. Adriana Simas

## 2022-11-07 NOTE — Progress Notes (Signed)
Subjective:  Patient ID: Victor Tran, male    DOB: July 02, 1966  Age: 56 y.o. MRN: [id number]  CC: Chief Complaint  Patient presents with   Follow-up    Patient went to ED for BP    HPI:  56 year old male with COPD and tobacco abuse presents for ER follow-up.  Patient seen in the ER yesterday.  Presented with dizziness and nausea.  ER note reflects that he has a history of hypertension.  He does not have a history of hypertension.  Patient was at work and experienced a headache, dizziness, and nausea.  EMS arrived on scene and blood pressure was moderately elevated.  CBG normal.  In the ER, his blood pressure was 141/92.  Labs unremarkable.  CT unremarkable.  EKG with no evidence of ischemia.  Troponin negative.  He was given IV fluids and acetaminophen and did well.  He was discharged home on meclizine.  Patient presents today reporting that he is feeling well.  His blood pressure is 138/87.  He is on no pharmacotherapy.  Denies headache.  Denies dizziness.  He is eager to return to work.  Patient Active Problem List   Diagnosis Date Noted   Headache 11/07/2022   COPD (chronic obstructive pulmonary disease) ([facility]) 04/17/2022   Healthcare maintenance 09/23/2018    Social Hx   Social History   Socioeconomic History   Marital status: Married    Spouse name: n/a   Number of children: 0   Years of education: 12   Highest education level: Not on file  Occupational History   Occupation: Insurance claims handler  Tobacco Use   Smoking status: Every Day    Packs/day: 0.50    Types: Cigarettes   Smokeless tobacco: Never   Tobacco comments:    down to two cigarettes a day  Vaping Use   Vaping Use: Never used  Substance and Sexual Activity   Alcohol use: Yes    Alcohol/week: 21.0 standard drinks of alcohol    Types: 21 Cans of beer per week    Comment: 2-3 cans of beer daily   Drug use: No   Sexual activity: Yes    Birth control/protection: Condom    Comment: married   Other Topics  Concern   Not on file  Social History Narrative   Works full time at [employer]   Married- first wife died tragically (he found her in bed, passed away)    Had 2 dogs 3 cats   Loves to fish    Social Determinants of Health   Financial Resource Strain: Not on file  Food Insecurity: Not on file  Transportation Needs: Not on file  Physical Activity: Not on file  Stress: Not on file  Social Connections: Not on file    Review of Systems Per HPI  Objective:  BP 138/87   Pulse 97   Temp 98.6 F (37 C) (Oral)   Ht 5\' 11"  (1.803 m)   Wt 149 lb 9.6 oz (67.9 kg)   SpO2 99%   BMI 20.86 kg/m      11/07/2022    9:06 AM 11/07/2022    8:59 AM 11/06/2022    1:11 PM  BP/Weight  Systolic BP 138 148 136  Diastolic BP 87 89 93  Wt. (Lbs)  149.6   BMI  20.86 kg/m2     Physical Exam Vitals and nursing note reviewed.  Constitutional:      General: He is not in acute distress.    Appearance:  Normal appearance.  HENT:     Head: Normocephalic and atraumatic.  Eyes:     General:        Right eye: No discharge.        Left eye: No discharge.     Conjunctiva/sclera: Conjunctivae normal.  Cardiovascular:     Rate and Rhythm: Normal rate and regular rhythm.  Pulmonary:     Effort: Pulmonary effort is normal.     Breath sounds: Normal breath sounds. No wheezing, rhonchi or rales.  Neurological:     Mental Status: He is alert.  Psychiatric:        Mood and Affect: Mood normal.        Behavior: Behavior normal.     Lab Results  Component Value Date   WBC 6.1 11/06/2022   HGB 13.6 11/06/2022   HCT 39.3 11/06/2022   PLT 301 11/06/2022   GLUCOSE 88 11/06/2022   CHOL 160 09/23/2018   TRIG 123 09/23/2018   HDL 64 09/23/2018   LDLCALC 71 09/23/2018   ALT 12 11/06/2022   AST 17 11/06/2022   NA 136 11/06/2022   K 4.4 11/06/2022   CL 106 11/06/2022   CREATININE 0.96 11/06/2022   BUN 10 11/06/2022   CO2 27 11/06/2022   HGBA1C 6.1 (H) 12/22/2012     Assessment &  Plan:   Problem List Items Addressed This Visit       Other   Headache - Primary    Patient presented with headache and associated nausea and dizziness.  He is currently asymptomatic.  There has been concern for hypertension.  Blood pressure 138/87 today without the use of pharmacotherapy.  Advised close monitoring at home.  Supportive care.  May return to work.       Follow-up:  Return in about 3 months (around 02/07/2023).  [place]

## 2022-11-13 ENCOUNTER — Ambulatory Visit: Payer: Medicare Other | Admitting: Family Medicine

## 2023-02-07 ENCOUNTER — Encounter: Payer: Self-pay | Admitting: Family Medicine

## 2023-02-07 ENCOUNTER — Ambulatory Visit (INDEPENDENT_AMBULATORY_CARE_PROVIDER_SITE_OTHER): Payer: 59 | Admitting: Family Medicine

## 2023-02-07 VITALS — BP 132/70 | Temp 97.7°F | Ht 71.0 in | Wt 147.0 lb

## 2023-02-07 DIAGNOSIS — J449 Chronic obstructive pulmonary disease, unspecified: Secondary | ICD-10-CM | POA: Diagnosis not present

## 2023-02-07 DIAGNOSIS — Z87891 Personal history of nicotine dependence: Secondary | ICD-10-CM | POA: Diagnosis not present

## 2023-02-07 DIAGNOSIS — D72829 Elevated white blood cell count, unspecified: Secondary | ICD-10-CM

## 2023-02-07 DIAGNOSIS — R030 Elevated blood-pressure reading, without diagnosis of hypertension: Secondary | ICD-10-CM | POA: Diagnosis not present

## 2023-02-07 DIAGNOSIS — Z125 Encounter for screening for malignant neoplasm of prostate: Secondary | ICD-10-CM

## 2023-02-07 DIAGNOSIS — E785 Hyperlipidemia, unspecified: Secondary | ICD-10-CM

## 2023-02-07 DIAGNOSIS — E875 Hyperkalemia: Secondary | ICD-10-CM

## 2023-02-07 DIAGNOSIS — Z1211 Encounter for screening for malignant neoplasm of colon: Secondary | ICD-10-CM

## 2023-02-07 DIAGNOSIS — R7309 Other abnormal glucose: Secondary | ICD-10-CM

## 2023-02-07 DIAGNOSIS — Z0000 Encounter for general adult medical examination without abnormal findings: Secondary | ICD-10-CM

## 2023-02-07 MED ORDER — BREZTRI AEROSPHERE 160-9-4.8 MCG/ACT IN AERO: 2.0000 | INHALATION_SPRAY | Freq: Two times a day (BID) | RESPIRATORY_TRACT | 11 refills | Status: AC

## 2023-02-07 MED ORDER — ALBUTEROL SULFATE HFA 108 (90 BASE) MCG/ACT IN AERS: 1.0000 | INHALATION_SPRAY | Freq: Four times a day (QID) | RESPIRATORY_TRACT | 6 refills | Status: DC | PRN

## 2023-02-07 NOTE — Patient Instructions (Addendum)
Referrals placed.  Labs today.  Medications sent.   Follow up in 6 months.

## 2023-02-08 ENCOUNTER — Encounter: Payer: Self-pay | Admitting: *Deleted

## 2023-02-08 DIAGNOSIS — Z87891 Personal history of nicotine dependence: Secondary | ICD-10-CM | POA: Insufficient documentation

## 2023-02-08 DIAGNOSIS — E785 Hyperlipidemia, unspecified: Secondary | ICD-10-CM | POA: Insufficient documentation

## 2023-02-08 LAB — CMP14+EGFR
ALT: 13 IU/L (ref 0–44)
AST: 22 IU/L (ref 0–40)
Albumin/Globulin Ratio: 2.2 (ref 1.2–2.2)
Albumin: 4.6 g/dL (ref 3.8–4.9)
Alkaline Phosphatase: 54 IU/L (ref 44–121)
BUN/Creatinine Ratio: 9 (ref 9–20)
BUN: 10 mg/dL (ref 6–24)
Bilirubin Total: 0.4 mg/dL (ref 0.0–1.2)
CO2: 23 mmol/L (ref 20–29)
Calcium: 9.8 mg/dL (ref 8.7–10.2)
Chloride: 104 mmol/L (ref 96–106)
Creatinine, Ser: 1.08 mg/dL (ref 0.76–1.27)
Globulin, Total: 2.1 g/dL (ref 1.5–4.5)
Glucose: 94 mg/dL (ref 70–99)
Potassium: 5.7 mmol/L — ABNORMAL HIGH (ref 3.5–5.2)
Sodium: 141 mmol/L (ref 134–144)
Total Protein: 6.7 g/dL (ref 6.0–8.5)
eGFR: 80 mL/min/{1.73_m2} (ref >59)

## 2023-02-08 LAB — CBC
Hematocrit: 44.6 % (ref 37.5–51.0)
Hemoglobin: 15.6 g/dL (ref 13.0–17.7)
MCH: 33.1 pg — ABNORMAL HIGH (ref 26.6–33.0)
MCHC: 35 g/dL (ref 31.5–35.7)
MCV: 95 fL (ref 79–97)
Platelets: 254 10*3/uL (ref 150–450)
RBC: 4.72 x10E6/uL (ref 4.14–5.80)
RDW: 12 % (ref 11.6–15.4)
WBC: 6.1 10*3/uL (ref 3.4–10.8)

## 2023-02-08 LAB — HEMOGLOBIN A1C
Est. average glucose Bld gHb Est-mCnc: 105 mg/dL
Hgb A1c MFr Bld: 5.3 % (ref 4.8–5.6)

## 2023-02-08 LAB — LIPID PANEL
Chol/HDL Ratio: 2.4 ratio (ref 0.0–5.0)
Cholesterol, Total: 160 mg/dL (ref 100–199)
HDL: 66 mg/dL (ref >39)
LDL Chol Calc (NIH): 60 mg/dL (ref 0–99)
Triglycerides: 213 mg/dL — ABNORMAL HIGH (ref 0–149)
VLDL Cholesterol Cal: 34 mg/dL (ref 5–40)

## 2023-02-08 NOTE — Progress Notes (Signed)
Subjective:  Patient ID: Victor Tran, male    DOB: 11-04-1966  Age: 57 y.o. MRN: [id number]  CC: Chief Complaint  Patient presents with   COPD    Discuss daily inhaler     HPI:  57 year old male with history of tobacco abuse and COPD presents for follow-up.  Patient has now quit smoking.  I congratulated him on this.  Patient currently does not have any medication regarding his COPD.  He would like to discuss medication for COPD today.  Patient in need of screening labs today.  Patient amenable to CT lung cancer screening.  Patient also amenable to colonoscopy.  Will place referrals.  Patient Active Problem List   Diagnosis Date Noted   Former smoker 02/08/2023   Hyperlipidemia 02/08/2023   COPD (chronic obstructive pulmonary disease) ([place]) 04/17/2022   Healthcare maintenance 09/23/2018    Social Hx   Social History   Socioeconomic History   Marital status: Married    Spouse name: n/a   Number of children: 0   Years of education: 12   Highest education level: Not on file  Occupational History   Occupation: Copywriter, advertising  Tobacco Use   Smoking status: Every Day    Packs/day: 0.25    Types: Cigarettes   Smokeless tobacco: Never   Tobacco comments:    down to two cigarettes a day  Vaping Use   Vaping Use: Never used  Substance and Sexual Activity   Alcohol use: Yes    Alcohol/week: 21.0 standard drinks of alcohol    Types: 21 Cans of beer per week    Comment: 2-3 cans of beer daily   Drug use: No   Sexual activity: Yes    Birth control/protection: Condom    Comment: married   Other Topics Concern   Not on file  Social History Narrative   Works full time at [employer]   Married- first wife died tragically (he found her in bed, passed away)    Had 2 dogs 3 cats   Loves to fish    Social Determinants of Health   Financial Resource Strain: Not on file  Food Insecurity: Not on file  Transportation Needs: Not on file  Physical Activity: Not on  file  Stress: Not on file  Social Connections: Not on file    Review of Systems  Constitutional: Negative.   Respiratory:  Negative for chest tightness.    Objective:  BP 132/70   Temp 97.7 F (36.5 C)   Ht 5' 11"$  (1.803 m)   Wt 147 lb (66.7 kg)   SpO2 99%   BMI 20.50 kg/m      02/07/2023   10:42 AM 02/07/2023   10:16 AM 02/07/2023   10:10 AM  BP/Weight  Systolic BP [id number]  Diastolic BP 70 91 81  Wt. (Lbs)   147  BMI   20.5 kg/m2    Physical Exam Vitals and nursing note reviewed.  Constitutional:      General: He is not in acute distress.    Appearance: Normal appearance.  HENT:     Head: Normocephalic and atraumatic.  Eyes:     General:        Right eye: No discharge.     Conjunctiva/sclera: Conjunctivae normal.  Cardiovascular:     Rate and Rhythm: Normal rate and regular rhythm.     Heart sounds: No murmur heard. Pulmonary:     Effort: Pulmonary effort is normal.  Breath sounds: Normal breath sounds. No wheezing, rhonchi or rales.  Neurological:     Mental Status: He is alert.     Lab Results  Component Value Date   WBC 6.1 02/07/2023   HGB 15.6 02/07/2023   HCT 44.6 02/07/2023   PLT 254 02/07/2023   GLUCOSE 94 02/07/2023   CHOL 160 02/07/2023   TRIG 213 (H) 02/07/2023   HDL 66 02/07/2023   LDLCALC 60 02/07/2023   ALT 13 02/07/2023   AST 22 02/07/2023   NA 141 02/07/2023   K 5.7 (H) 02/07/2023   CL 104 02/07/2023   CREATININE 1.08 02/07/2023   BUN 10 02/07/2023   CO2 23 02/07/2023   HGBA1C 5.3 02/07/2023     Assessment & Plan:   Problem List Items Addressed This Visit       Respiratory   COPD (chronic obstructive pulmonary disease) ([place]) - Primary    Starting on Breztri.  Albuterol as needed.      Relevant Medications   Budeson-Glycopyrrol-Formoterol (BREZTRI AEROSPHERE) 160-9-4.8 MCG/ACT AERO   albuterol (VENTOLIN HFA) 108 (90 Base) MCG/ACT inhaler     Other   Hyperlipidemia   Relevant Orders   Lipid panel  (Completed)   Healthcare maintenance    Referral placed for lung cancer screening and referral placed for colonoscopy.      Former smoker   Relevant Orders   Ambulatory Referral Lung Cancer Screening [facility]   Other Visit Diagnoses     Encounter for screening colonoscopy       Relevant Orders   Ambulatory referral to Gastroenterology   Leukocytosis, unspecified type       Relevant Orders   CBC (Completed)   Elevated BP without diagnosis of hypertension       Relevant Orders   CMP14+EGFR (Completed)   Prostate cancer screening       Elevated glucose       Relevant Orders   Hemoglobin A1c (Completed)       Meds ordered this encounter  Medications   Budeson-Glycopyrrol-Formoterol (BREZTRI AEROSPHERE) 160-9-4.8 MCG/ACT AERO    Sig: Inhale 2 puffs into the lungs 2 (two) times daily.    Dispense:  10.7 g    Refill:  11   albuterol (VENTOLIN HFA) 108 (90 Base) MCG/ACT inhaler    Sig: Inhale 1-2 puffs into the lungs every 6 (six) hours as needed for wheezing or shortness of breath.    Dispense:  8 g    Refill:  6    Follow-up:  Return in about 6 months (around 08/08/2023).  [place]

## 2023-02-08 NOTE — Assessment & Plan Note (Signed)
Referral placed for lung cancer screening and referral placed for colonoscopy.

## 2023-02-08 NOTE — Assessment & Plan Note (Signed)
Starting on [place].  Albuterol as needed.

## 2023-02-09 NOTE — Addendum Note (Signed)
Addended by: Dairl Ponder on: 02/09/2023 10:24 AM   Modules accepted: Orders

## 2023-05-30 ENCOUNTER — Other Ambulatory Visit: Payer: Self-pay

## 2023-05-30 ENCOUNTER — Encounter: Payer: Self-pay | Admitting: Emergency Medicine

## 2023-05-30 ENCOUNTER — Ambulatory Visit
Admission: EM | Admit: 2023-05-30 | Discharge: 2023-05-30 | Disposition: A | Discharge status: Home / Self Care | Payer: Medicare Other | Attending: Nurse Practitioner | Admitting: Nurse Practitioner

## 2023-05-30 ENCOUNTER — Ambulatory Visit (INDEPENDENT_AMBULATORY_CARE_PROVIDER_SITE_OTHER): Payer: BC Managed Care – PPO

## 2023-05-30 DIAGNOSIS — R000 Tachycardia, unspecified: Secondary | ICD-10-CM

## 2023-05-30 DIAGNOSIS — R0602 Shortness of breath: Secondary | ICD-10-CM

## 2023-05-30 DIAGNOSIS — Z8709 Personal history of other diseases of the respiratory system: Secondary | ICD-10-CM

## 2023-05-30 MED ORDER — ALBUTEROL SULFATE HFA 108 (90 BASE) MCG/ACT IN AERS: 2.0000 | INHALATION_SPRAY | Freq: Four times a day (QID) | RESPIRATORY_TRACT | 1 refills | Status: DC | PRN
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
## 2023-05-30 NOTE — Discharge Instructions (Signed)
The chest x-ray is consistent with COPD. Continue use of your albuterol inhaler as prescribed. Increase fluids and allow for plenty of rest. Recommend complete smoking cessation to prevent further recurrences of shortness of breath. If symptoms worsen to include the inability to speak in a complete sentence, worsening shortness of breath, or difficulty breathing, please go to the emergency department immediately for further evaluation. Please follow-up with your primary care physician within the next 7 to 14 days or sooner if needed for reevaluation. Follow-up as needed.
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
## 2023-06-05 ENCOUNTER — Encounter: Payer: Self-pay | Admitting: Emergency Medicine

## 2023-07-11 DIAGNOSIS — Z0279 Encounter for issue of other medical certificate: Secondary | ICD-10-CM

## 2023-07-12 ENCOUNTER — Telehealth: Payer: Self-pay

## 2023-07-12 NOTE — Telephone Encounter (Signed)
Pt come by to pay for FMLA needs to be completed placed in Dr Adriana Simas folder. Also he said he misplaced paperwork on doing a colonoscopy.

## 2023-07-16 NOTE — Telephone Encounter (Signed)
Patient states it is for chronic bronchitis and COPD

## 2023-07-16 NOTE — Telephone Encounter (Signed)
Victor Sams, DO    His previous FMLA was from a hospitalization. What is this FMLA regarding?

## 2023-07-24 ENCOUNTER — Encounter: Payer: Self-pay | Admitting: *Deleted

## 2023-08-08 ENCOUNTER — Telehealth: Payer: Self-pay

## 2023-08-08 ENCOUNTER — Ambulatory Visit: Payer: 59 | Admitting: Family Medicine

## 2023-08-08 NOTE — Telephone Encounter (Signed)
Prescription Request  08/08/2023  LOV: Visit date not found  What is the name of the medication or equipment? Budeson-Glycopyrrol-Formoterol (BREZTRI AEROSPHERE) 160-9-4.8 MCG/ACT AERO albuterol (VENTOLIN HFA) 108 (90 Base) MCG/ACT inhaler   Have you contacted your pharmacy to request a refill? Yes   Which pharmacy would you like this sent to? [facility]  Patient notified that their request is being sent to the clinical staff for review and that they should receive a response within 2 business days.   Please advise at Mobile [phone] (mobile)

## 2023-08-15 ENCOUNTER — Other Ambulatory Visit: Payer: Self-pay

## 2023-08-15 ENCOUNTER — Other Ambulatory Visit: Payer: Self-pay | Admitting: Family Medicine

## 2023-08-15 ENCOUNTER — Telehealth: Payer: Self-pay | Admitting: Family Medicine

## 2023-08-15 MED ORDER — ALBUTEROL SULFATE HFA 108 (90 BASE) MCG/ACT IN AERS: 2.0000 | INHALATION_SPRAY | Freq: Four times a day (QID) | RESPIRATORY_TRACT | 1 refills | Status: DC | PRN

## 2023-08-15 MED ORDER — ALBUTEROL SULFATE (2.5 MG/3ML) 0.083% IN NEBU: 2.5000 mg | INHALATION_SOLUTION | Freq: Four times a day (QID) | RESPIRATORY_TRACT | 1 refills | Status: DC | PRN

## 2023-08-15 NOTE — Telephone Encounter (Signed)
Refill on albuterol (VENTOLIN HFA) 108 (90 Base) MCG/ACT inhaler and neb solution also. Send to [facility], He had appointment for medication follow up on 8/29 but has Covid and can't come in. He states completely out of medication

## 2023-08-15 NOTE — Telephone Encounter (Signed)
Called pt and informed of refill of inhaler sent to [facility]. Pt asked about his nebulizer treatments, I informed him I refilled the only thing on his med list  albuterol (VENTOLIN HFA) 108 (90 Base) MCG/ACT inhaler

## 2023-08-16 ENCOUNTER — Ambulatory Visit: Payer: Medicare Other | Admitting: Family Medicine

## 2023-10-07 ENCOUNTER — Other Ambulatory Visit: Payer: Self-pay | Admitting: Family Medicine

## 2024-01-29 ENCOUNTER — Encounter: Payer: Self-pay | Admitting: *Deleted

## 2024-09-03 ENCOUNTER — Encounter: Payer: Self-pay | Admitting: Emergency Medicine

## 2024-09-03 ENCOUNTER — Other Ambulatory Visit: Payer: Self-pay

## 2024-09-03 ENCOUNTER — Ambulatory Visit
Admission: EM | Admit: 2024-09-03 | Discharge: 2024-09-03 | Disposition: A | Discharge status: Home / Self Care | Attending: Nurse Practitioner | Admitting: Nurse Practitioner

## 2024-09-03 ENCOUNTER — Ambulatory Visit (INDEPENDENT_AMBULATORY_CARE_PROVIDER_SITE_OTHER)

## 2024-09-03 DIAGNOSIS — R059 Cough, unspecified: Secondary | ICD-10-CM

## 2024-09-03 DIAGNOSIS — J449 Chronic obstructive pulmonary disease, unspecified: Secondary | ICD-10-CM

## 2024-09-03 MED ORDER — METHYLPREDNISOLONE SODIUM SUCC 125 MG IJ SOLR
125.0000 mg | Freq: Once | INTRAMUSCULAR | Status: AC
  Administered 2024-09-03: 125 mg via INTRAMUSCULAR

## 2024-09-03 MED ORDER — ALBUTEROL SULFATE HFA 108 (90 BASE) MCG/ACT IN AERS: 2.0000 | INHALATION_SPRAY | Freq: Four times a day (QID) | RESPIRATORY_TRACT | 0 refills | Status: DC | PRN

## 2024-09-03 MED ORDER — DOXYCYCLINE HYCLATE 100 MG PO TABS: 100.0000 mg | ORAL_TABLET | Freq: Two times a day (BID) | ORAL | 0 refills | Status: AC

## 2024-09-03 MED ORDER — IPRATROPIUM-ALBUTEROL 0.5-2.5 (3) MG/3ML IN SOLN
3.0000 mL | Freq: Once | RESPIRATORY_TRACT | Status: AC
  Administered 2024-09-03: 3 mL via RESPIRATORY_TRACT

## 2024-09-03 MED ORDER — GUAIFENESIN 100 MG/5ML PO LIQD: 10.0000 mL | Freq: Four times a day (QID) | ORAL | 0 refills | Status: AC | PRN

## 2024-09-03 MED ORDER — ALBUTEROL SULFATE (2.5 MG/3ML) 0.083% IN NEBU: 2.5000 mg | INHALATION_SOLUTION | Freq: Four times a day (QID) | RESPIRATORY_TRACT | 0 refills | Status: DC | PRN

## 2024-09-03 NOTE — ED Triage Notes (Signed)
 Pt reports cough and intermittent dyspnea x1 week. Pt reports is out of nebulizer medication and states inhaler is almost out. Last use of inhaler approx 1 hour pta to UC.

## 2024-09-03 NOTE — ED Provider Notes (Signed)
 [facility]    CSN: [id number] Arrival date & time: 09/03/24  0851      History   Chief Complaint Chief Complaint  Patient presents with   Cough    HPI [name] is a 58 y.o. male.   The history is provided by the patient.   Patient presents with a 1 week history of wheezing, shortness of breath, difficulty breathing, and productive cough.  Patient reports prior history of asthma and COPD.  States that he is almost out of his albuterol  inhaler, states that he has used all of his nebulizer medication.  Patient denies fever, chills, chest pain, abdominal pain, nausea, vomiting, diarrhea, or rash.  Patient reports that he quit smoking approximately 4 to 5 months ago.  He denies any obvious close sick contacts.  Past Medical History:  Diagnosis Date   Allergy    Asthma    Hypertension    Nephrolithiasis    Tobacco abuse     Patient Active Problem List   Diagnosis Date Noted   Former smoker 02/08/2023   Hyperlipidemia 02/08/2023   COPD (chronic obstructive pulmonary disease) ([facility]) 04/17/2022   Healthcare maintenance 09/23/2018    Past Surgical History:  Procedure Laterality Date   EYE SURGERY         Home Medications    Prior to Admission medications   Medication Sig Start Date End Date Taking? Authorizing Provider  albuterol  (PROVENTIL ) (2.5 MG/3ML) 0.083% nebulizer solution Take 3 mLs (2.5 mg total) by nebulization every 6 (six) hours as needed for wheezing or shortness of breath. 08/15/23   Cook, Jayce G, DO  albuterol  (VENTOLIN  HFA) 108 (90 Base) MCG/ACT inhaler INHALE 2 PUFFS BY MOUTH EVERY 6 HOURS AS NEEDED FOR WHEEZING FOR SHORTNESS OF BREATH 10/09/23   Cook, Jayce G, DO  Budeson-Glycopyrrol-Formoterol (BREZTRI  AEROSPHERE) 160-9-4.8 MCG/ACT AERO Inhale 2 puffs into the lungs 2 (two) times daily. 02/07/23   Cook, Jayce G, DO    Family History Family History  Problem Relation Age of Onset   Diabetes Mother    Hypertension Mother    Heart  disease Mother    Stroke Father    Heart disease Father    Diabetes Father    HIV/AIDS Brother    Cancer Brother 23       Colon Cancer    Social History Social History   Tobacco Use   Smoking status: Former    Current packs/day: 0.10    Types: Cigarettes   Smokeless tobacco: Never   Tobacco comments:    down to two cigarettes a day  Vaping Use   Vaping status: Never Used  Substance Use Topics   Alcohol use: Yes    Alcohol/week: 21.0 standard drinks of alcohol    Types: 21 Cans of beer per week    Comment: 2-3 cans of beer daily   Drug use: No     Allergies   Patient has no known allergies.   Review of Systems Review of Systems Per HPI  Physical Exam Triage Vital Signs ED Triage Vitals [09/03/24 0939]  Encounter Vitals Group     BP 127/89     Girls Systolic BP Percentile      Girls Diastolic BP Percentile      Boys Systolic BP Percentile      Boys Diastolic BP Percentile      Pulse Rate (!) 109     Resp (!) 24     Temp 97.9 F (36.6 C)  Temp Source Oral     SpO2 95 %     Weight      Height      Head Circumference      Peak Flow      Pain Score 0     Pain Loc      Pain Education      Exclude from Growth Chart    No data found.  Updated Vital Signs BP 127/89 (BP Location: Right Arm)   Pulse (!) 109   Temp 97.9 F (36.6 C) (Oral)   Resp (!) 24   SpO2 95%   Visual Acuity Right Eye Distance:   Left Eye Distance:   Bilateral Distance:    Right Eye Near:   Left Eye Near:    Bilateral Near:     Physical Exam Vitals and nursing note reviewed.  Constitutional:      General: He is in acute distress (Patient is speaking in short sentences, breathing is labored.).     Appearance: Normal appearance.  HENT:     Head: Normocephalic.     Right Ear: Tympanic membrane, ear canal and external ear normal.     Left Ear: Tympanic membrane, ear canal and external ear normal.     Nose: Nose normal.     Mouth/Throat:     Mouth: Mucous membranes are  moist.  Eyes:     Extraocular Movements: Extraocular movements intact.     Conjunctiva/sclera: Conjunctivae normal.     Pupils: Pupils are equal, round, and reactive to light.  Cardiovascular:     Rate and Rhythm: Regular rhythm. Tachycardia present.     Pulses: Normal pulses.     Heart sounds: Normal heart sounds.  Pulmonary:     Breath sounds: Examination of the right-upper field reveals wheezing and rhonchi. Examination of the left-upper field reveals wheezing and rhonchi. Examination of the right-lower field reveals wheezing and rhonchi. Examination of the left-lower field reveals wheezing and rhonchi. Wheezing and rhonchi present.  Abdominal:     General: Bowel sounds are normal.     Palpations: Abdomen is soft.     Tenderness: There is no abdominal tenderness.  Musculoskeletal:     Cervical back: Normal range of motion.  Skin:    General: Skin is warm and dry.  Neurological:     General: No focal deficit present.     Mental Status: He is alert and oriented to person, place, and time.  Psychiatric:        Mood and Affect: Mood normal.        Behavior: Behavior normal.      UC Treatments / Results  Labs (all labs ordered are listed, but only abnormal results are displayed) Labs Reviewed - No data to display  EKG   Radiology No results found.  Procedures Procedures (including critical care time)  Medications Ordered in UC Medications  ipratropium-albuterol  (DUONEB) 0.5-2.5 (3) MG/3ML nebulizer solution 3 mL (3 mLs Nebulization Given 09/03/24 0959)  methylPREDNISolone  sodium succinate (SOLU-MEDROL ) 125 mg/2 mL injection 125 mg (125 mg Intramuscular Given 09/03/24 0959)    Initial Impression / Assessment and Plan / UC Course  I have reviewed the triage vital signs and the nursing notes.  Pertinent labs & imaging results that were available during my care of the patient were reviewed by me and considered in my medical decision making (see chart for  details).  Patient presented for complaints of shortness of breath, wheezing, difficulty breathing, and productive cough x 1 week.  On initial exam, patient had rhonchi and wheezing noted throughout.  2 DuoNebs were administered, post DuoNeb, sats ranged between 93 to 98%.  Patient does continue to have expiratory wheezing noted, rhonchi has improved.  Patient was also given Solu-Medrol  125 mg IM.  Patient is speaking in complete sentences, although he does remain mildly tachypneic.  Chest x-ray was negative for pneumonia or other active cardiopulmonary disease.  Will treat for COPD exacerbation with prednisone  50 mg, refill the patient's albuterol  inhaler and nebulizer, and start doxycycline  100 mg for COPD exacerbation.  For the cough, guaifenesin  100 mg was prescribed.  Supportive care recommendations were provided and discussed with the patient to include fluids, rest, over-the-counter analgesics, and use of a humidifier during sleep.  Discussed strict ER follow-up precautions.  Patient was advised to follow-up with his PCP within the next 5 to 7 days for reevaluation, also recommend discussing referral to pulmonology with PCP.  Patient was in agreement with this plan of care and verbalizes understanding.  All questions were answered.  Patient stable for discharge.   Final Clinical Impressions(s) / UC Diagnoses   Final diagnoses:  Chronic obstructive pulmonary disease, unspecified COPD type [facility])   Discharge Instructions   None    ED Prescriptions   None    PDMP not reviewed this encounter.   Victor Tran PARAS, NP 09/03/24 1125

## 2024-09-03 NOTE — Discharge Instructions (Signed)
 You were given 2 nebulizer treatments and Solu-Medrol  125 mg today.  Start the prednisone  tomorrow. Take medication as prescribed. Increase fluids and allow for plenty of rest. You may take over-the-counter Tylenol  as needed for pain, fever, or general discomfort. Recommend use of a humidifier in your bedroom at nighttime during sleep and sleeping elevated on pillows while cough symptoms persist. Go to the emergency department immediately if you experience worsening shortness of breath, difficulty breathing, or become unable to speak in a complete sentence. Please follow-up with your primary care physician within the next 5 to 7 days for reevaluation.  Also discussed referral for pulmonology with your PCP. Follow-up as needed.

## 2024-12-20 ENCOUNTER — Ambulatory Visit (INDEPENDENT_AMBULATORY_CARE_PROVIDER_SITE_OTHER)

## 2024-12-20 ENCOUNTER — Ambulatory Visit
Admission: EM | Admit: 2024-12-20 | Discharge: 2024-12-20 | Disposition: A | Discharge status: Home / Self Care | Attending: Nurse Practitioner | Admitting: Nurse Practitioner

## 2024-12-20 ENCOUNTER — Other Ambulatory Visit: Payer: Self-pay

## 2024-12-20 DIAGNOSIS — R059 Cough, unspecified: Secondary | ICD-10-CM | POA: Diagnosis not present

## 2024-12-20 DIAGNOSIS — R509 Fever, unspecified: Secondary | ICD-10-CM

## 2024-12-20 DIAGNOSIS — J449 Chronic obstructive pulmonary disease, unspecified: Secondary | ICD-10-CM | POA: Diagnosis not present

## 2024-12-20 LAB — POCT INFLUENZA A/B
Influenza A, POC: NEGATIVE
Influenza B, POC: NEGATIVE

## 2024-12-20 LAB — POC SOFIA SARS ANTIGEN FIA: SARS Coronavirus 2 Ag: NEGATIVE

## 2024-12-20 MED ORDER — IPRATROPIUM-ALBUTEROL 0.5-2.5 (3) MG/3ML IN SOLN: 3.0000 mL | Freq: Once | RESPIRATORY_TRACT | Status: DC

## 2024-12-20 MED ORDER — DOXYCYCLINE HYCLATE 100 MG PO TABS: 100.0000 mg | ORAL_TABLET | Freq: Two times a day (BID) | ORAL | 0 refills | Status: AC

## 2024-12-20 MED ORDER — IPRATROPIUM-ALBUTEROL 0.5-2.5 (3) MG/3ML IN SOLN
3.0000 mL | Freq: Once | RESPIRATORY_TRACT | Status: AC
  Administered 2024-12-20: 3 mL via RESPIRATORY_TRACT

## 2024-12-20 MED ORDER — METHYLPREDNISOLONE SODIUM SUCC 125 MG IJ SOLR
125.0000 mg | Freq: Once | INTRAMUSCULAR | Status: AC
  Administered 2024-12-20: 125 mg via INTRAMUSCULAR

## 2024-12-20 MED ORDER — ALBUTEROL SULFATE HFA 108 (90 BASE) MCG/ACT IN AERS: 2.0000 | INHALATION_SPRAY | Freq: Four times a day (QID) | RESPIRATORY_TRACT | 0 refills | Status: AC | PRN

## 2024-12-20 MED ORDER — PREDNISONE 20 MG PO TABS: 40.0000 mg | ORAL_TABLET | Freq: Every day | ORAL | 0 refills | Status: AC

## 2024-12-20 MED ORDER — IPRATROPIUM BROMIDE 0.02 % IN SOLN
0.5000 mg | Freq: Once | RESPIRATORY_TRACT | Status: AC
  Administered 2024-12-20: 0.5 mg via RESPIRATORY_TRACT

## 2024-12-20 MED ORDER — PROMETHAZINE-DM 6.25-15 MG/5ML PO SYRP: 5.0000 mL | ORAL_SOLUTION | Freq: Four times a day (QID) | ORAL | 0 refills | Status: AC | PRN

## 2024-12-20 NOTE — ED Triage Notes (Signed)
 Pt being seen in UC for cough and shortness of breath x 2 weeks. Pt reports having a fever earlier in week, highest 103F.  Pt reports having PMH of COPD. Pt concerned for bronchitis.

## 2024-12-20 NOTE — Discharge Instructions (Signed)
 The chest x-ray was negative for active cardiopulmonary disease. You were given an injection of Solu-Medrol  125 mg.  Start the prednisone  tomorrow. Take medication as prescribed.  Start the prednisone  tomorrow. You may take over-the-counter Tylenol  as needed for pain, fever, or general discomfort. Recommend use of a humidifier in your bedroom at nighttime during sleep and sleeping elevated on pillows while symptoms persist. Go to the emergency department if you experience worsening shortness of breath, wheezing, difficulty breathing, chest pain, or other concerns. Recommend follow-up with your primary care physician within the next 7 to 10 days to discuss if referral to pulmonology is recommended. Follow-up as needed.

## 2024-12-20 NOTE — ED Provider Notes (Signed)
 " [facility]    CSN: [id number] Arrival date & time: 12/20/24  0957      History   Chief Complaint Chief Complaint  Patient presents with   Cough   Shortness of Breath    HPI Victor Tran is a 59 y.o. male.   The history is provided by the patient.   Patient presents for complaints of cough, wheezing, shortness of breath, and fever that has been present for the past 2 weeks.  Patient states that he has had intermittent chest tightness, fever during this time, states that his highest fever was 103 which was earlier in the week.  He states over the past several days, he has developed shortness of breath, difficulty breathing, and wheezing.  He is having difficulty speaking in complete sentences at this time.  He denies headache, ear pain, sore throat, nasal congestion, runny nose, chest pain, abdominal pain, nausea, vomiting, diarrhea, or rash.  Patient reports prior history of asthma and COPD.  States that he is no longer smoking.  States that he did use his nebulizer this morning with minimal relief.  Past Medical History:  Diagnosis Date   Allergy    Asthma    Hypertension    Nephrolithiasis    Tobacco abuse     Patient Active Problem List   Diagnosis Date Noted   Former smoker 02/08/2023   Hyperlipidemia 02/08/2023   COPD (chronic obstructive pulmonary disease) (HCC) 04/17/2022   Healthcare maintenance 09/23/2018    Past Surgical History:  Procedure Laterality Date   EYE SURGERY         Home Medications    Prior to Admission medications  Medication Sig Start Date End Date Taking? Authorizing Provider  albuterol  (PROVENTIL ) (2.5 MG/3ML) 0.083% nebulizer solution Take 3 mLs (2.5 mg total) by nebulization every 6 (six) hours as needed for wheezing or shortness of breath. 09/03/24   Leath-Warren, Etta PARAS, NP  albuterol  (VENTOLIN  HFA) 108 (90 Base) MCG/ACT inhaler Inhale 2 puffs into the lungs every 6 (six) hours as needed. Patient not taking: Reported  on 12/20/2024 09/03/24   Leath-Warren, Etta PARAS, NP  Budeson-Glycopyrrol-Formoterol (BREZTRI  AEROSPHERE) 160-9-4.8 MCG/ACT AERO Inhale 2 puffs into the lungs 2 (two) times daily. 02/07/23   Cook, Jayce G, DO  guaiFENesin  (ROBITUSSIN) 100 MG/5ML liquid Take 10 mLs by mouth every 6 (six) hours as needed for cough or to loosen phlegm. Patient not taking: Reported on 12/20/2024 09/03/24   Leath-Warren, Etta PARAS, NP    Family History Family History  Problem Relation Age of Onset   Diabetes Mother    Hypertension Mother    Heart disease Mother    Stroke Father    Heart disease Father    Diabetes Father    HIV/AIDS Brother    Cancer Brother 2       Colon Cancer    Social History Social History[1]   Allergies   Patient has no known allergies.   Review of Systems Review of Systems Per HPI  Physical Exam Triage Vital Signs ED Triage Vitals  Encounter Vitals Group     BP 12/20/24 1042 132/79     Girls Systolic BP Percentile --      Girls Diastolic BP Percentile --      Boys Systolic BP Percentile --      Boys Diastolic BP Percentile --      Pulse Rate 12/20/24 1042 (!) 116     Resp 12/20/24 1042 (!) 25     Temp  12/20/24 1042 98.4 F (36.9 C)     Temp Source 12/20/24 1042 Oral     SpO2 12/20/24 1042 91 %     Weight --      Height --      Head Circumference --      Peak Flow --      Pain Score 12/20/24 1043 5     Pain Loc --      Pain Education --      Exclude from Growth Chart --    No data found.  Updated Vital Signs BP 132/79 (BP Location: Right Arm)   Pulse (!) 120   Temp 98.4 F (36.9 C) (Oral)   Resp (!) 27   SpO2 94%   Visual Acuity Right Eye Distance:   Left Eye Distance:   Bilateral Distance:    Right Eye Near:   Left Eye Near:    Bilateral Near:     Physical Exam Vitals and nursing note reviewed.  Constitutional:      General: He is not in acute distress (mild d/t SOB).    Appearance: He is well-developed.  HENT:     Head: Normocephalic.      Right Ear: Tympanic membrane, ear canal and external ear normal.     Left Ear: Tympanic membrane, ear canal and external ear normal.     Mouth/Throat:     Mouth: Mucous membranes are moist.  Eyes:     Pupils: Pupils are equal, round, and reactive to light.  Cardiovascular:     Rate and Rhythm: Regular rhythm. Tachycardia present.     Pulses: Normal pulses.     Heart sounds: Normal heart sounds.  Pulmonary:     Effort: Pulmonary effort is normal.     Breath sounds: Examination of the right-upper field reveals wheezing and rhonchi. Examination of the left-upper field reveals wheezing and rhonchi. Examination of the right-lower field reveals wheezing and rhonchi. Examination of the left-lower field reveals wheezing and rhonchi. Wheezing and rhonchi present.  Abdominal:     General: Bowel sounds are normal.     Palpations: Abdomen is soft.  Musculoskeletal:     Cervical back: Normal range of motion.  Skin:    General: Skin is warm and dry.  Neurological:     General: No focal deficit present.     Mental Status: He is alert and oriented to person, place, and time.  Psychiatric:        Mood and Affect: Mood normal.        Behavior: Behavior normal.      UC Treatments / Results  Labs (all labs ordered are listed, but only abnormal results are displayed) Labs Reviewed  POCT INFLUENZA A/B - Normal  POC SOFIA SARS ANTIGEN FIA    EKG   Radiology DG Chest 2 View Result Date: 12/20/2024 CLINICAL DATA:  Cough, unspecified type. Fever, unspecified. Unspecified COPD type. EXAM: CHEST - 2 VIEW COMPARISON:  09/03/2024 FINDINGS: Again noted is a mild curvature in the thoracic spine that is similar to the previous examination. Heart size is normal. Trachea is midline. There may be mild hyperinflation that is similar to the previous examination. No focal lung disease. No pulmonary edema. No large pleural effusions. IMPRESSION: No acute chest abnormality. Electronically Signed   By: Juliene Balder  M.D.   On: 12/20/2024 11:35    Procedures Procedures (including critical care time)  Medications Ordered in UC Medications  ipratropium-albuterol  (DUONEB) 0.5-2.5 (3) MG/3ML nebulizer solution 3 mL (  3 mLs Nebulization Given 12/20/24 1057)  methylPREDNISolone  sodium succinate (SOLU-MEDROL ) 125 mg/2 mL injection 125 mg (125 mg Intramuscular Given 12/20/24 1057)  ipratropium (ATROVENT ) nebulizer solution 0.5 mg (0.5 mg Nebulization Given 12/20/24 1129)    Initial Impression / Assessment and Plan / UC Course  I have reviewed the triage vital signs and the nursing notes.  Pertinent labs & imaging results that were available during my care of the patient were reviewed by me and considered in my medical decision making (see chart for details).  The chest x-ray was negative for active cardiopulmonary disease.  On initial exam, patient with wheezing, shortness of breath, difficulty breathing.  DuoNeb and Atrovent  nebulizers were administered along with Solu-Medrol  125 mg.  Patient reports some improvement of breathing after the nebulizer treatments.  COVID and influenza test were negative.  Patient's symptoms consistent with viral URI that has exacerbated his COPD.  Will treat with doxycycline  100 mg twice daily, prednisone  40 mg, and Promethazine  DM for the cough.  Patient requesting albuterol  inhaler be refilled.  Supportive care recommendations were provided discussed with the patient to include fluids, rest, over-the-counter analgesics, and use of a humidifier during sleep.  Patient was given strict ER follow-up precautions, along with recommendations to follow-up with PCP to discuss possible referral to pulmonology.  Patient was in agreement with this plan of care and verbalizes understanding.  All questions were answered.  Patient stable for discharge.  Final Clinical Impressions(s) / UC Diagnoses   Final diagnoses:  Cough, unspecified type  Fever, unspecified  Chronic obstructive pulmonary disease,  unspecified COPD type [facility])   Discharge Instructions   None    ED Prescriptions   None    PDMP not reviewed this encounter.    [1]  Social History Tobacco Use   Smoking status: Former    Current packs/day: 0.10    Types: Cigarettes   Smokeless tobacco: Never   Tobacco comments:    down to two cigarettes a day  Vaping Use   Vaping status: Never Used  Substance Use Topics   Alcohol use: Yes    Alcohol/week: 21.0 standard drinks of alcohol    Types: 21 Cans of beer per week    Comment: 2-3 cans of beer daily   Drug use: No     Gilmer Etta PARAS, NP 12/20/24 1154  "
